# Patient Record
Sex: Female | Born: 1950 | Race: Black or African American | Hispanic: No | Smoking: Never smoker
Health system: Southern US, Community
[De-identification: ages and names within clinical notes are randomized; demographics above are authoritative.]

## PROBLEM LIST (undated history)

## (undated) DIAGNOSIS — C50919 Malignant neoplasm of unspecified site of unspecified female breast: Secondary | ICD-10-CM

## (undated) DIAGNOSIS — I119 Hypertensive heart disease without heart failure: Secondary | ICD-10-CM

## (undated) DIAGNOSIS — E7800 Pure hypercholesterolemia, unspecified: Secondary | ICD-10-CM

## (undated) DIAGNOSIS — G4730 Sleep apnea, unspecified: Secondary | ICD-10-CM

## (undated) DIAGNOSIS — M1990 Unspecified osteoarthritis, unspecified site: Secondary | ICD-10-CM

## (undated) DIAGNOSIS — C259 Malignant neoplasm of pancreas, unspecified: Secondary | ICD-10-CM

## (undated) DIAGNOSIS — R011 Cardiac murmur, unspecified: Secondary | ICD-10-CM

## (undated) DIAGNOSIS — Z8669 Personal history of other diseases of the nervous system and sense organs: Secondary | ICD-10-CM

## (undated) DIAGNOSIS — I350 Nonrheumatic aortic (valve) stenosis: Secondary | ICD-10-CM

## (undated) DIAGNOSIS — K219 Gastro-esophageal reflux disease without esophagitis: Secondary | ICD-10-CM

## (undated) DIAGNOSIS — Z853 Personal history of malignant neoplasm of breast: Secondary | ICD-10-CM

## (undated) DIAGNOSIS — R51 Headache: Secondary | ICD-10-CM

## (undated) DIAGNOSIS — I341 Nonrheumatic mitral (valve) prolapse: Secondary | ICD-10-CM

## (undated) DIAGNOSIS — C801 Malignant (primary) neoplasm, unspecified: Secondary | ICD-10-CM

## (undated) DIAGNOSIS — Z87442 Personal history of urinary calculi: Secondary | ICD-10-CM

## (undated) DIAGNOSIS — I10 Essential (primary) hypertension: Secondary | ICD-10-CM

## (undated) HISTORY — DX: Essential (primary) hypertension: I10

## (undated) HISTORY — DX: Hypertensive heart disease without heart failure: I11.9

## (undated) HISTORY — DX: Nonrheumatic aortic (valve) stenosis: I35.0

## (undated) HISTORY — DX: Personal history of malignant neoplasm of breast: Z85.3

## (undated) HISTORY — DX: Personal history of other diseases of the nervous system and sense organs: Z86.69

## (undated) HISTORY — PX: MASTECTOMY: SHX3

## (undated) HISTORY — DX: Nonrheumatic mitral (valve) prolapse: I34.1

## (undated) HISTORY — DX: Pure hypercholesterolemia, unspecified: E78.00

---
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
## 1992-08-27 HISTORY — PX: TUBAL LIGATION: SHX77

## 2000-03-27 ENCOUNTER — Emergency Department (HOSPITAL_COMMUNITY): Admission: EM | Admit: 2000-03-27 | Discharge: 2000-03-27 | Payer: Self-pay | Admitting: Emergency Medicine

## 2000-04-19 ENCOUNTER — Other Ambulatory Visit: Admission: RE | Admit: 2000-04-19 | Discharge: 2000-04-19 | Payer: Self-pay | Admitting: Obstetrics and Gynecology

## 2000-05-08 ENCOUNTER — Ambulatory Visit (HOSPITAL_COMMUNITY): Admission: RE | Admit: 2000-05-08 | Discharge: 2000-05-08 | Payer: Self-pay | Admitting: Obstetrics and Gynecology

## 2000-05-08 ENCOUNTER — Encounter: Payer: Self-pay | Admitting: Obstetrics and Gynecology

## 2000-09-03 ENCOUNTER — Other Ambulatory Visit: Admission: RE | Admit: 2000-09-03 | Discharge: 2000-09-03 | Payer: Self-pay | Admitting: Obstetrics and Gynecology

## 2000-09-03 ENCOUNTER — Encounter (INDEPENDENT_AMBULATORY_CARE_PROVIDER_SITE_OTHER): Payer: Self-pay | Admitting: Specialist

## 2001-02-15 ENCOUNTER — Observation Stay (HOSPITAL_COMMUNITY): Admission: EM | Admit: 2001-02-15 | Discharge: 2001-02-16 | Payer: Self-pay | Admitting: Emergency Medicine

## 2001-02-15 ENCOUNTER — Encounter: Payer: Self-pay | Admitting: Emergency Medicine

## 2001-06-23 ENCOUNTER — Ambulatory Visit (HOSPITAL_COMMUNITY): Admission: RE | Admit: 2001-06-23 | Discharge: 2001-06-23 | Payer: Self-pay | Admitting: Obstetrics and Gynecology

## 2001-06-23 ENCOUNTER — Encounter: Payer: Self-pay | Admitting: Obstetrics and Gynecology

## 2001-09-26 ENCOUNTER — Other Ambulatory Visit: Admission: RE | Admit: 2001-09-26 | Discharge: 2001-09-26 | Payer: Self-pay | Admitting: Obstetrics and Gynecology

## 2002-06-26 ENCOUNTER — Ambulatory Visit (HOSPITAL_COMMUNITY): Admission: RE | Admit: 2002-06-26 | Discharge: 2002-06-26 | Payer: Self-pay | Admitting: Obstetrics and Gynecology

## 2002-06-26 ENCOUNTER — Encounter: Payer: Self-pay | Admitting: Obstetrics and Gynecology

## 2002-12-22 ENCOUNTER — Other Ambulatory Visit: Admission: RE | Admit: 2002-12-22 | Discharge: 2002-12-22 | Payer: Self-pay | Admitting: Obstetrics and Gynecology

## 2003-11-12 ENCOUNTER — Other Ambulatory Visit: Admission: RE | Admit: 2003-11-12 | Discharge: 2003-11-12 | Payer: Self-pay | Admitting: Obstetrics and Gynecology

## 2003-12-10 ENCOUNTER — Ambulatory Visit (HOSPITAL_COMMUNITY): Admission: RE | Admit: 2003-12-10 | Discharge: 2003-12-10 | Payer: Self-pay | Admitting: Obstetrics and Gynecology

## 2004-01-03 ENCOUNTER — Encounter: Admission: RE | Admit: 2004-01-03 | Discharge: 2004-01-03 | Payer: Self-pay | Admitting: Obstetrics and Gynecology

## 2004-04-18 ENCOUNTER — Encounter: Admission: RE | Admit: 2004-04-18 | Discharge: 2004-05-05 | Payer: Self-pay | Admitting: Occupational Medicine

## 2005-02-01 ENCOUNTER — Other Ambulatory Visit: Admission: RE | Admit: 2005-02-01 | Discharge: 2005-02-01 | Payer: Self-pay | Admitting: Obstetrics and Gynecology

## 2005-02-09 ENCOUNTER — Encounter: Admission: RE | Admit: 2005-02-09 | Discharge: 2005-02-09 | Payer: Self-pay | Admitting: Obstetrics and Gynecology

## 2005-07-09 ENCOUNTER — Encounter (INDEPENDENT_AMBULATORY_CARE_PROVIDER_SITE_OTHER): Payer: Self-pay | Admitting: *Deleted

## 2005-07-09 ENCOUNTER — Ambulatory Visit (HOSPITAL_COMMUNITY): Admission: RE | Admit: 2005-07-09 | Discharge: 2005-07-09 | Payer: Self-pay | Admitting: Gastroenterology

## 2006-05-03 ENCOUNTER — Encounter: Admission: RE | Admit: 2006-05-03 | Discharge: 2006-05-03 | Payer: Self-pay | Admitting: Obstetrics and Gynecology

## 2006-05-14 ENCOUNTER — Encounter: Admission: RE | Admit: 2006-05-14 | Discharge: 2006-05-14 | Payer: Self-pay | Admitting: Obstetrics and Gynecology

## 2006-08-27 HISTORY — PX: OTHER SURGICAL HISTORY: SHX169

## 2006-09-16 ENCOUNTER — Encounter: Admission: RE | Admit: 2006-09-16 | Discharge: 2006-09-16 | Payer: Self-pay | Admitting: Occupational Medicine

## 2006-11-11 ENCOUNTER — Encounter: Admission: RE | Admit: 2006-11-11 | Discharge: 2006-12-13 | Payer: Self-pay | Admitting: Internal Medicine

## 2006-11-25 ENCOUNTER — Encounter: Admission: RE | Admit: 2006-11-25 | Discharge: 2006-11-25 | Payer: Self-pay | Admitting: Obstetrics and Gynecology

## 2006-11-25 ENCOUNTER — Encounter (INDEPENDENT_AMBULATORY_CARE_PROVIDER_SITE_OTHER): Payer: Self-pay | Admitting: *Deleted

## 2006-11-25 ENCOUNTER — Encounter (INDEPENDENT_AMBULATORY_CARE_PROVIDER_SITE_OTHER): Payer: Self-pay | Admitting: Diagnostic Radiology

## 2006-11-25 DIAGNOSIS — C50919 Malignant neoplasm of unspecified site of unspecified female breast: Secondary | ICD-10-CM

## 2006-11-25 HISTORY — DX: Malignant neoplasm of unspecified site of unspecified female breast: C50.919

## 2006-12-12 ENCOUNTER — Encounter: Admission: RE | Admit: 2006-12-12 | Discharge: 2006-12-12 | Payer: Self-pay | Admitting: Obstetrics and Gynecology

## 2007-01-22 ENCOUNTER — Encounter (INDEPENDENT_AMBULATORY_CARE_PROVIDER_SITE_OTHER): Payer: Self-pay | Admitting: Surgery

## 2007-01-22 ENCOUNTER — Inpatient Hospital Stay (HOSPITAL_COMMUNITY): Admission: RE | Admit: 2007-01-22 | Discharge: 2007-01-24 | Payer: Self-pay | Admitting: Surgery

## 2007-01-28 ENCOUNTER — Ambulatory Visit: Payer: Self-pay | Admitting: Oncology

## 2007-04-03 ENCOUNTER — Emergency Department (HOSPITAL_COMMUNITY): Admission: EM | Admit: 2007-04-03 | Discharge: 2007-04-03 | Payer: Self-pay | Admitting: *Deleted

## 2007-06-17 ENCOUNTER — Encounter: Payer: Self-pay | Admitting: Endocrinology

## 2007-06-25 ENCOUNTER — Encounter: Payer: Self-pay | Admitting: Endocrinology

## 2007-07-10 ENCOUNTER — Ambulatory Visit: Payer: Self-pay | Admitting: Endocrinology

## 2007-07-10 DIAGNOSIS — I10 Essential (primary) hypertension: Secondary | ICD-10-CM | POA: Insufficient documentation

## 2007-07-10 DIAGNOSIS — I359 Nonrheumatic aortic valve disorder, unspecified: Secondary | ICD-10-CM | POA: Insufficient documentation

## 2007-07-10 DIAGNOSIS — E119 Type 2 diabetes mellitus without complications: Secondary | ICD-10-CM | POA: Insufficient documentation

## 2007-07-16 ENCOUNTER — Telehealth (INDEPENDENT_AMBULATORY_CARE_PROVIDER_SITE_OTHER): Payer: Self-pay | Admitting: *Deleted

## 2007-07-18 ENCOUNTER — Telehealth: Payer: Self-pay | Admitting: Endocrinology

## 2007-07-22 ENCOUNTER — Ambulatory Visit: Payer: Self-pay | Admitting: Endocrinology

## 2007-07-28 ENCOUNTER — Encounter: Payer: Self-pay | Admitting: Endocrinology

## 2007-08-18 ENCOUNTER — Encounter: Admission: RE | Admit: 2007-08-18 | Discharge: 2007-08-18 | Payer: Self-pay | Admitting: Endocrinology

## 2007-10-01 ENCOUNTER — Telehealth: Payer: Self-pay | Admitting: Endocrinology

## 2007-10-14 ENCOUNTER — Encounter: Payer: Self-pay | Admitting: Endocrinology

## 2008-05-29 ENCOUNTER — Emergency Department (HOSPITAL_COMMUNITY): Admission: EM | Admit: 2008-05-29 | Discharge: 2008-05-29 | Payer: Self-pay | Admitting: Emergency Medicine

## 2008-09-20 ENCOUNTER — Ambulatory Visit: Payer: Self-pay | Admitting: Sports Medicine

## 2008-09-20 DIAGNOSIS — M719 Bursopathy, unspecified: Secondary | ICD-10-CM

## 2008-09-20 DIAGNOSIS — M67919 Unspecified disorder of synovium and tendon, unspecified shoulder: Secondary | ICD-10-CM | POA: Insufficient documentation

## 2008-09-21 ENCOUNTER — Encounter: Payer: Self-pay | Admitting: Sports Medicine

## 2008-09-29 ENCOUNTER — Encounter: Admission: RE | Admit: 2008-09-29 | Discharge: 2008-12-14 | Payer: Self-pay | Admitting: Sports Medicine

## 2008-10-04 ENCOUNTER — Encounter: Payer: Self-pay | Admitting: Sports Medicine

## 2008-10-13 ENCOUNTER — Encounter: Payer: Self-pay | Admitting: Sports Medicine

## 2008-10-25 ENCOUNTER — Ambulatory Visit: Payer: Self-pay | Admitting: Sports Medicine

## 2008-11-04 ENCOUNTER — Ambulatory Visit: Payer: Self-pay | Admitting: Oncology

## 2008-11-08 LAB — COMPREHENSIVE METABOLIC PANEL
ALT: 17 U/L (ref 0–35)
Albumin: 4.5 g/dL (ref 3.5–5.2)
Alkaline Phosphatase: 83 U/L (ref 39–117)
CO2: 25 mEq/L (ref 19–32)
Glucose, Bld: 112 mg/dL — ABNORMAL HIGH (ref 70–99)
Potassium: 3.7 mEq/L (ref 3.5–5.3)
Sodium: 138 mEq/L (ref 135–145)
Total Bilirubin: 0.3 mg/dL (ref 0.3–1.2)
Total Protein: 7.4 g/dL (ref 6.0–8.3)

## 2008-11-08 LAB — CBC WITH DIFFERENTIAL/PLATELET
BASO%: 0.5 % (ref 0.0–2.0)
Eosinophils Absolute: 0.1 10*3/uL (ref 0.0–0.5)
LYMPH%: 37.2 % (ref 14.0–49.7)
MCHC: 33.4 g/dL (ref 31.5–36.0)
MONO#: 0.5 10*3/uL (ref 0.1–0.9)
MONO%: 6.7 % (ref 0.0–14.0)
NEUT#: 3.7 10*3/uL (ref 1.5–6.5)
RBC: 3.94 10*6/uL (ref 3.70–5.45)
RDW: 13.1 % (ref 11.2–14.5)
WBC: 6.8 10*3/uL (ref 3.9–10.3)

## 2008-12-22 ENCOUNTER — Encounter: Payer: Self-pay | Admitting: Sports Medicine

## 2009-02-15 ENCOUNTER — Ambulatory Visit: Payer: Self-pay | Admitting: Sports Medicine

## 2009-07-04 ENCOUNTER — Emergency Department: Admission: EM | Admit: 2009-07-04 | Discharge: 2009-07-04 | Payer: Self-pay | Admitting: Emergency Medicine

## 2010-09-17 ENCOUNTER — Encounter: Payer: Self-pay | Admitting: Obstetrics and Gynecology

## 2011-01-09 NOTE — Op Note (Signed)
NAMEMarland Hicks  April, Hicks NO.:  [id number]   MEDICAL RECORD NO.:  [id number]          PATIENT TYPE:  INP   LOCATION:  2899                         FACILITY:  [facility]   PHYSICIAN:  Currie Paris, M.D.DATE OF BIRTH:  04-13-1951   DATE OF PROCEDURE:  01/22/2007  DATE OF DISCHARGE:                               OPERATIVE REPORT   OFFICE MEDICAL RECORD NUMBER [id number]   PREOPERATIVE DIAGNOSIS:  Ductal carcinoma in situ, left breast, lower  inner quadrant.   POSTOPERATIVE DIAGNOSIS:  Ductal carcinoma in situ, left breast, lower  inner quadrant.   PROCEDURE:  Left total mastectomy with blue dye injection and sentinel  lymph node biopsy; right total mastectomy.   SURGEON:  Currie Paris, M.D.   ASSISTANT:  Rose Phi. Maple Hudson, M.D.   ANESTHESIA:  General endotracheal.   CLINICAL HISTORY:  This is a 60 year old lady who has just been  diagnosed with an early breast cancer.  She has a family history of  breast cancer and was strongly desirous not only of a mastectomy for her  known cancer but a prophylactic mastectomy on the contralateral side.  We spoke to her length about that, and I went over the fact that this  would not likely lead to any increase in survivability, given that she  has only DCIS.  We talk to her about the standard being for a lumpectomy  and radiation and that since she has low-grade cancer, she might have  been able to escape radiation.  She was seen in consultation by  radiation therapy.  She also saw a Engineer, petroleum, and after giving  this full consideration elected to proceed as she had originally planned  with a left total mastectomy, sentinel lymph node biopsy, and right  total mastectomy.  She plans for a delayed reconstruction and has seen  Dr. Pleas Patricia.   DESCRIPTION OF PROCEDURE:  The patient was seen in the holding area, and  she had no further questions.  We reviewed again that we planned for a  left total mastectomy  with sentinel node evaluation and right total  mastectomy.  I initialed the left side as the operative side as the  patient had already done.  When I saw her, she had already had her  radioisotope injected.   The patient was taken to the operating room and after satisfactory  general anesthesia had been obtained, the area around the left nipple-  areolar complex was prepped with alcohol.  The time out occurred.  I  then injected 5 mL of dilute methylene blue (2 mL of methylene blue and  3 mL of injectable saline) subareolarly and massaged that in.   At this point, both breasts were prepped with Betadine and draped as a  single sterile field.  The right breast was covered with a towel.  I  used a Neoprobe and identified and marked a hot area in the axilla.  I  then made an elliptical incision and raised a superior flap medially to  the sternum, superiorly to the clavicle, and laterally out into the  axilla.  Once we got into the axillary fatty tissue, I used  the Neoprobe  and found where the node was.  I made a little subcu divided and then  found 2 blue lymphatics leading to what was either 1 large or 2 separate  fairly closely attached lymph nodes, both of which had counts in the  500s.  These were excised and sent as a sentinel node.  I found no other  blue nodes and no other palpably abnormal nodes and no hot areas in the  axilla.   Attention was turned back to the breast, and I completed the inferior  skin flap going to the inframammary fold and then laterally out to the  latissimus.  The breast was then removed starting medially and working  laterally using cautery and taking the fascia.  When I got to the  clavipectoral fascia, I was able to put some traction on the breast and  divide that and then divide the breast off of the chest wall without any  further entry into the axilla.  We spent several minutes making sure  everything was dry.  I put two 19 Blake drains in and secured  them with  a 3-0 nylon, leaving 1 in the axilla and 1 under the superior flap.  We  irrigated and again checked for hemostasis.  Everything was dry and the  wound was closed with staples.   Instruments and gloves were changed, and we covered the left side with a  towel and then exposed the right side.  This mastectomy was completed  essentially in the same fashion as the left, except that we did not did  not enter the axilla proper at all.  Flaps were made in a similar  fashion, and the breast was removed from medial to lateral, taking the  fascia.  Again, we irrigated and made sure everything was dry.  I placed  19 Blake drains and watched them for a few minutes to make sure the  flaps stayed dry and then closed with staples.   The patient tolerated the procedure well.  There no operative  complications.  All counts were correct.  Estimated blood loss was 150  mL.      Currie Paris, M.D.  Electronically Signed     CJS/MEDQ  D:  01/22/2007  T:  01/22/2007  Job:  [id number]   cc:   Marcelino Duster L. Vincente Poli, M.D.  Consuello Bossier., M.D.

## 2011-01-12 NOTE — Discharge Summary (Signed)
[facility]  Patient:    [name], [name]               MRN: [id number] Adm. Date:  [id number] Disc. Date: [id number] Attending:  Rudean Hitt CC:         Redmond Baseman, M.D.   Discharge Summary  FINAL DIAGNOSES: 1. Chest pain, myocardial infarction ruled out. 2. Mitral valve prolapse. 3. Hypertensive cardiovascular disease. 4. History of hypercholesterolemia. 5. Mild anemia.  PROCEDURE:  None.  HISTORY OF PRESENT ILLNESS:  This is a 60 year old black female admitted with substernal pain and left chest pain.  She had the onset of pain about 10 p.m. the night before she came to the emergency room.  It occurred while she was watching TV.  She had had oriental food for supper several hours earlier. The intensity of the pain waxed and waned.  The pain was substernal and also behind the left breast and down into the left biceps.  There was slight dyspnea, but no nausea, vomiting, or diaphoresis.  She took three aspirin at about 3 a.m. prior to coming to the emergency room and the pain seemed less severe after that.  The patient does have a past history of high blood pressure and has been on Hyzaar once a day 100/25 strength.  She is also on Premphase for early menopausea. She has had a known heart murmur since birth and has been told of a mitral valve prolapse and does take SBE prophylaxis.  FAMILY HISTORY: REVIEW OF SYSTEMS: SOCIAL HISTORY:  As noted in the H&P.  PHYSICAL EXAMINATION:  Unremarkable except for a grade 3/6 harsh systolic murmur loudest at the base, but also heard at the apex and radiates to the neck. There is no diastolic murmur, no gallop, click, or rub.  ABDOMEN: negative.  EXTREMITIES: Negative.  VITAL SIGNS: Blood pressure 139/74.  Her chest x-ray was normal.  EKG showed nonspecific T wave changes with T wave inversions in 2, 3, aVF and V3 through V5.  Repeat EKG the next day showed  no change.  LABORATORY DATA:  Admission labs were unremarkable with potassium of 3.7.  HOSPITAL COURSE:  The patient was admitted to telemetry and serial enzymes were obtained.  She was treated empirically with IV heparin.  She had no further chest discomfort.  Enzymes came back negative x 3.  She was able to be discharged on Sunday morning, November 16, 2000, to be followed closely in the office.  I felt that her chest pain may be secondary to mitral valve prolapse. A gastrointestinal etiology could not be excluded as well.  ACCESSORY CLINICAL DATA:  Hematocrit 34, hemoglobin 11.9, potassium 3.7, liver function tests were normal.  Cardiac enzymes were negative x 3.  Urinalysis was negative with negative nitrite and few bacteria.  Lipid panel was drawn on the morning of discharge and is pending.  The patient will be discharged on Premphase as directed, Nitrostat 1/150 p.r.n. for chest pain, Protonix 40 mg one daily, Hyzaar 100/25 one daily, coated aspirin 81 mg daily.  She is to walk as tolerated.  She will be on a low cholesterol diet avoiding extra salt.  She is to call my office on Monday, June 24, to arrange for a one-day treadmill Cardiolite stress test as an outpatient.  Her weight at discharge is 212.  CONDITION ON DISCHARGE:  Improved. DD:  02/16/01 TD:  02/17/01 Job: [id number]

## 2011-01-12 NOTE — H&P (Signed)
[facility]  Patient:    [name], [name]               MRN: [id number] Adm. Date:  [id number] Attending:  Cathren Laine CC:         Redmond Baseman, M.D., [facility]   History and Physical  CHIEF COMPLAINT:  Chest pain.  HISTORY:  This is a 60 year old black female admitted with substernal and left chest discomfort.  She had the onset of the discomfort at about 10 p.m. last evening while television.  She had had some oriental food for supper and initially felt it might be indigestion but did not take any antacids.  The intensity of the pain waxed and waned, and she found it difficult to sleep. She tried using her CPAP machine and noticed that the pain improved somewhat. At about 3 a.m. she took some aspirin, and this also seemed to help the pain. The pain was in the substernal area, radiating through under the left breast, and up into the shoulder and down into the left biceps area.  There was slight dyspnea.  There was no diaphoresis or nausea.  The patient has a past history of high blood pressure and has been on medication for about a year and a half.  Initially she was on Toprol and a diuretic and now is on Hyzaar 100/25, 1 daily.  Other medications include Premphase 0.625/5, as directed.  Of note, is the fact that the patient has had a heart murmur since birth and prior to moving to [place], she got an echocardiogram at a hospital in [place] each year.  She does know about SVE prophylaxis and uses it.  SOCIAL HISTORY:  She is a Engineer, civil (consulting) at [employer].  She is a nonsmoker.  She uses occasional alcohol.  She is married and has two children.  ALLERGIES:  PENICILLIN, SHELLFISH, IODINE.  FAMILY HISTORY:  Mother is living at age 70 and is in reasonably good health. Father died at age 7 of a stroke and heart failure.  The patient has four sisters and four brothers.  All are in good health.  None of her  family members have had any known premature coronary disease or heart murmurs, as far as she knows.  The patient herself does have a history of elevated cholesterol.  REVIEW OF SYSTEMS:  GASTROINTESTINAL:  No history of peptic ulcer, hiatal hernia, or gastroesophageal reflux.  GENITOURINARY:  No symptoms.  The remainder of the review of systems unremarkable.  PAST SURGICAL HISTORY:  Fractured hip at age 21.  She has also had a tubal ligation.  PHYSICAL EXAMINATION:  VITAL SIGNS:  Blood pressure 139/74, pulse 63 and regular, respirations normal.  HEENT:  Negative.  NECK:  Jugular venous pressure normal.  Carotids have a normal upstroke but has a loud bruit bilaterally transmitted from the heart.  CHEST:  Clear.  HEART:  Grade 3/6 harsh systolic ejection murmur loudest at the base but also right at the apex.  It seems to radiate most to the neck.  There is n o gallop or rub.  ABDOMEN:  Soft and nontender.  EXTREMITIES:  Good pulses.  No edema.  Chest x-ray shows no active disease.  Heart size is upper normal.  Lungs are clear.  Electrocardiogram shows nonspecific T wave inversion in leads II, III, aVF, and V3 through V5.  Initial CK-MB and troponin I are negative.  Potassium is normal at 3.7.  Renal function is normal.  IMPRESSION: 1. Chest pain, rule out myocardial infarction. 2. Harsh systolic murmur, loudest at the base suggestive of idiopathic    hypertrophic subaortic stenosis or aortic stenosis, although the patient    has been told of mitral valve prolapse in the past. 3. History of hypercholesterolemia. 4. History of hypertension.  DISPOSITION:  Admit to telemetry.  Serial enzymes and EKGs.  IV heparin by pharmacy protocol.  Sublingual nitroglycerin as necessary for recurrent pain. Daily aspirin.  Will anticipate getting an electrocardiogram either as an inpatient or an outpatient.  Echoes are not available on the weekends. DD:  02/15/01 TD:  02/15/01 Job:  [id number]

## 2011-01-12 NOTE — Op Note (Signed)
[name], [name]      ACCOUNT NO.:  [id number]   MEDICAL RECORD NO.:  [id number]          PATIENT TYPE:  AMB   LOCATION:  ENDO                         FACILITY:  [facility]   PHYSICIAN:  Petra Kuba, M.D.    DATE OF BIRTH:  1950/09/05   DATE OF PROCEDURE:  07/09/2005  DATE OF DISCHARGE:                                 OPERATIVE REPORT   PROCEDURE:  Colonoscopy.   INDICATIONS FOR PROCEDURE:  Screening.  Consent was signed after risks,  benefits, methods, and options were thoroughly discussed in the office.   MEDICATIONS USED:  Demerol 100, Versed 10.5.   PROCEDURE:  Rectal inspection was pertinent for small external hemorrhoids.  Digital exam was negative.  The video colonoscope was inserted and with some  difficulty due to a long looping colon, with rolling her on her back and  abdominal pressure, we were able to advance to the cecum which was  identified by the appendiceal orifice and the ileocecal valve.  A few  sigmoid polyps were seen on insertion, these were significant but fairly  distal, with one right next to each other.  No other abnormalities were seen  on insertion.  The scope was slowly withdrawn.  The prep was adequate, there  was minimal liquid stool that required washing and suctioning.  On slow  withdrawal through the colon, the cecum, ascending, transverse, and  descending were all normal.  The scope was slowly withdrawn back to the  distal sigmoid.  Both polyps that were seen on insertion were brought into  view and both were snared, electrocautery applied, and the polyps were  removed, suctioned through the scope and collected in the trap, and put in  the same container.  The scope was withdrawn back to the rectum.  Anorectal  pull through and retroflexion confirmed some small hemorrhoids.  The scope  was straightened and readvanced a short ways up the left side of the colon,  air was suctioned, the scope was removed.  The patient tolerated the  procedure well.  There was no obvious complications.   ENDOSCOPIC DIAGNOSIS:  1.  Internal and external hemorrhoids.  2.  Two tiny to small distal sigmoid polyps, snared.  3.  Otherwise, within normal limits to the cecum.   PLAN:  Await pathology, probably recheck in five years, happy to see back  sooner p.r.n., otherwise return care to Dr. Vincente Hicks for the customary health  care screening and maintenance.           ______________________________  Petra Kuba, M.D.     MEM/MEDQ  D:  07/09/2005  T:  07/10/2005  Job:  [id number]   cc:   [name], M.D.  Fax: [phone]

## 2011-03-16 ENCOUNTER — Other Ambulatory Visit: Payer: Self-pay | Admitting: Cardiology

## 2011-03-16 DIAGNOSIS — E785 Hyperlipidemia, unspecified: Secondary | ICD-10-CM

## 2011-03-16 NOTE — Telephone Encounter (Signed)
escribe request  

## 2011-04-09 ENCOUNTER — Telehealth: Payer: Self-pay | Admitting: Cardiology

## 2011-04-09 ENCOUNTER — Other Ambulatory Visit: Payer: Self-pay | Admitting: Cardiology

## 2011-04-09 NOTE — Telephone Encounter (Signed)
escribe request  

## 2011-04-09 NOTE — Telephone Encounter (Signed)
Wants to come by and get samples of Benecar. She said that she is out of them and realizes that she is due to make an appointment with Dr. Patty Sermons.

## 2011-05-28 ENCOUNTER — Telehealth: Payer: Self-pay | Admitting: Cardiology

## 2011-05-28 NOTE — Telephone Encounter (Signed)
Walk In pt Form " Pt Brought in Exemption Questionnaire Form" sent to [facility] Nurse for Today)  05/28/11/km

## 2011-07-02 ENCOUNTER — Ambulatory Visit: Payer: Self-pay | Admitting: Cardiology

## 2011-07-25 ENCOUNTER — Other Ambulatory Visit: Payer: Self-pay | Admitting: *Deleted

## 2011-07-25 MED ORDER — BENICAR HCT 40-12.5 MG PO TABS: 1.0000 | ORAL_TABLET | Freq: Every day | ORAL | Status: DC

## 2011-07-26 ENCOUNTER — Other Ambulatory Visit: Payer: Self-pay | Admitting: Cardiology

## 2011-07-26 ENCOUNTER — Other Ambulatory Visit: Payer: Self-pay | Admitting: *Deleted

## 2011-07-26 MED ORDER — BENICAR HCT 40-12.5 MG PO TABS: 1.0000 | ORAL_TABLET | Freq: Every day | ORAL | Status: DC

## 2011-07-26 NOTE — Telephone Encounter (Signed)
New Msg: Pt at pharmacy now, needs refill of Benicar called in. Pt has appt with Dr. Patty Sermons scheduled for 12/17--but doesn't have enough RX to last until then. Pt call transferred to triage.

## 2011-07-26 NOTE — Telephone Encounter (Signed)
Called requesting refill on Benicar. Is at [facility] pharmacy now.

## 2011-08-10 ENCOUNTER — Encounter: Payer: Self-pay | Admitting: *Deleted

## 2011-08-10 DIAGNOSIS — I341 Nonrheumatic mitral (valve) prolapse: Secondary | ICD-10-CM | POA: Insufficient documentation

## 2011-08-10 DIAGNOSIS — E7800 Pure hypercholesterolemia, unspecified: Secondary | ICD-10-CM | POA: Insufficient documentation

## 2011-08-10 DIAGNOSIS — Z8669 Personal history of other diseases of the nervous system and sense organs: Secondary | ICD-10-CM | POA: Insufficient documentation

## 2011-08-10 DIAGNOSIS — I350 Nonrheumatic aortic (valve) stenosis: Secondary | ICD-10-CM | POA: Insufficient documentation

## 2011-08-10 DIAGNOSIS — Z853 Personal history of malignant neoplasm of breast: Secondary | ICD-10-CM | POA: Insufficient documentation

## 2011-08-10 DIAGNOSIS — I119 Hypertensive heart disease without heart failure: Secondary | ICD-10-CM | POA: Insufficient documentation

## 2011-08-13 ENCOUNTER — Ambulatory Visit: Payer: Self-pay | Admitting: Cardiology

## 2011-09-10 ENCOUNTER — Encounter: Payer: Self-pay | Admitting: Cardiology

## 2011-09-10 ENCOUNTER — Ambulatory Visit (INDEPENDENT_AMBULATORY_CARE_PROVIDER_SITE_OTHER): Payer: Self-pay | Admitting: Cardiology

## 2011-09-10 VITALS — BP 140/94 | HR 61 | Ht 64.0 in | Wt 219.0 lb

## 2011-09-10 DIAGNOSIS — E785 Hyperlipidemia, unspecified: Secondary | ICD-10-CM

## 2011-09-10 DIAGNOSIS — I350 Nonrheumatic aortic (valve) stenosis: Secondary | ICD-10-CM

## 2011-09-10 DIAGNOSIS — I10 Essential (primary) hypertension: Secondary | ICD-10-CM

## 2011-09-10 DIAGNOSIS — E7800 Pure hypercholesterolemia, unspecified: Secondary | ICD-10-CM

## 2011-09-10 DIAGNOSIS — E119 Type 2 diabetes mellitus without complications: Secondary | ICD-10-CM

## 2011-09-10 DIAGNOSIS — I359 Nonrheumatic aortic valve disorder, unspecified: Secondary | ICD-10-CM

## 2011-09-10 DIAGNOSIS — I119 Hypertensive heart disease without heart failure: Secondary | ICD-10-CM

## 2011-09-10 LAB — BASIC METABOLIC PANEL
CO2: 25 mEq/L (ref 19–32)
Chloride: 99 mEq/L (ref 96–112)
Potassium: 3.7 mEq/L (ref 3.5–5.1)

## 2011-09-10 LAB — HEPATIC FUNCTION PANEL
ALT: 21 U/L (ref 0–35)
AST: 20 U/L (ref 0–37)
Alkaline Phosphatase: 72 U/L (ref 39–117)
Bilirubin, Direct: 0.1 mg/dL (ref 0.0–0.3)
Total Bilirubin: 0.4 mg/dL (ref 0.3–1.2)
Total Protein: 7.7 g/dL (ref 6.0–8.3)

## 2011-09-10 LAB — LIPID PANEL
Cholesterol: 276 mg/dL — ABNORMAL HIGH (ref 0–200)
Total CHOL/HDL Ratio: 5
VLDL: 23.2 mg/dL (ref 0.0–40.0)

## 2011-09-10 MED ORDER — BENICAR HCT 40-12.5 MG PO TABS: 1.0000 | ORAL_TABLET | Freq: Every day | ORAL | Status: DC

## 2011-09-10 MED ORDER — ZETIA 10 MG PO TABS: 10.0000 mg | ORAL_TABLET | Freq: Every day | ORAL | Status: DC

## 2011-09-10 NOTE — Progress Notes (Signed)
[name] Date of Birth:  05-31-1951 [facility] [address] [place], [place]  [address] [phone]         Fax   [phone]  History of Present Illness: This pleasant 61 year old African American woman is seen after an 18 month absence.  She has a past history of dyslipidemia, aortic stenosis, and diabetes.  She also has a history of essential hypertension.  Since last visit she has been feeling well.  He is not having him do cardiac symptoms.  She has a history of whitecoat hypertension.  She states that when she has her blood pressure checked at work is normal in the range of 130/78.  Current Outpatient Prescriptions  Medication Sig Dispense Refill  . aspirin 81 MG tablet Take 81 mg by mouth daily.        Marland Kitchen BENICAR HCT 40-12.5 MG per tablet Take 1 tablet by mouth daily.  90 tablet  3  . Calcium Carbonate-Vitamin D (CALCIUM 600 + D PO) Take by mouth daily.        . metFORMIN (GLUCOPHAGE) 500 MG tablet Take 500 mg by mouth daily.        . Multiple Vitamin (MULTIVITAMIN) tablet Take 1 tablet by mouth daily.      . Omega-3 Fatty Acids (FISH OIL PO) Take by mouth daily.        . vitamin C (ASCORBIC ACID) 500 MG tablet Take 500 mg by mouth 2 (two) times daily.      Marland Kitchen ZETIA 10 MG tablet Take 1 tablet (10 mg total) by mouth daily.  90 tablet  3    Allergies  Allergen Reactions  . Crestor (Rosuvastatin Calcium)     myalgias  . Iodine   . Other     Shell fish  . Penicillins   . Pravachol     Muscle pain    Patient Active Problem List  Diagnoses  . DIABETES MELLITUS, TYPE II  . HYPERTENSION  . AORTIC STENOSIS  . Unspecified disorders of bursae and tendons in shoulder region  . Aortic stenosis  . Hypercholesterolemia  . MVP (mitral valve prolapse)  . Hypertensive cardiovascular disease  . HX: breast cancer  . History of migraines    History  Smoking status  . Never Smoker   Smokeless tobacco  . Not on file    History  Alcohol  Use     Family History  Problem Relation Age of Onset  . Hypertension Mother   . Hypertension Father     Review of Systems: Constitutional: no fever chills diaphoresis or fatigue or change in weight.  Head and neck: no hearing loss, no epistaxis, no photophobia or visual disturbance. Respiratory: No cough, shortness of breath or wheezing. Cardiovascular: No chest pain peripheral edema, palpitations. Gastrointestinal: No abdominal distention, no abdominal pain, no change in bowel habits hematochezia or melena. Genitourinary: No dysuria, no frequency, no urgency, no nocturia. Musculoskeletal:No arthralgias, no back pain, no gait disturbance or myalgias. Neurological: No dizziness, no headaches, no numbness, no seizures, no syncope, no weakness, no tremors. Hematologic: No lymphadenopathy, no easy bruising. Psychiatric: No confusion, no hallucinations, no sleep disturbance.    Physical Exam: Filed Vitals:   09/10/11 1110  BP: 140/94  Pulse:    the general appearance reveals a well-developed well-nourished woman in no distress.  Weight is down 2 pounds since last visit.  I rechecked her blood pressure and his elevated.  140/94.Pupils equal and reactive.   Extraocular Movements are full.  There is no scleral icterus.  The mouth and pharynx are normal.  The neck is supple.  The carotids reveal no bruits.  The jugular venous pressure is normal.  The thyroid is not enlarged.  There is no lymphadenopathy.  The chest is clear to percussion and auscultation. There are no rales or rhonchi. Expansion of the chest is symmetrical.  The precordium is quiet.  The first heart sound is normal.  The second heart sound is physiologically split.  There is no  gallop rub or click.  There is a grade 3/6 harsh systolic ejection murmur at the base radiating toward the neck.  There is no abnormal lift or heave. The abdomen is soft and nontender. Bowel sounds are normal. The liver and spleen are not enlarged.  There Are no abdominal masses. There are no bruits.  The pedal pulses are good.  There is no phlebitis or edema.  There is no cyanosis or clubbing. Strength is normal and symmetrical in all extremities.  There is no lateralizing weakness.  There are no sensory deficits.  The skin is warm and dry.  There is no rash.  EKG shows normal sinus rhythm and nonspecific anterior T wave changes.  The tracing is unchanged since 2005.   Assessment / Plan: She came fasting today and we sent her for lab work including lipid panel hepatic function panel and basal metabolic panel.  This reflects no lipid-lowering medication for the past 6 months.  She'll return in 6 months for followup office visit and fasting lab work.  Continue regular exercise and weight reduction and low salt heart healthy diabetic diet.

## 2011-09-10 NOTE — Patient Instructions (Addendum)
Will obtain labs today and call you with the results (lp/bmet/hfp) Your physician recommends that you continue on your current medications as directed. Please refer to the Current Medication list given to you today. Your physician wants you to follow-up in: 6 months You will receive a reminder letter in the mail two months in advance. If you don't receive a letter, please call our office to schedule the follow-up appointment.     

## 2011-09-10 NOTE — Assessment & Plan Note (Signed)
The patient has not had any symptoms from her blood pressure.  As noted her blood pressure outside the doctor's office is normal.  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

## 2011-09-10 NOTE — Assessment & Plan Note (Signed)
The patient is not having any cardinal symptoms of severe aortic stenosis.  Her last echocardiogram on 01/30/10 showed a peak gradient of 38 and a mean gradient of 21.

## 2011-09-10 NOTE — Assessment & Plan Note (Signed)
The patient has a history of hypercholesterolemia on a familial basis.  She is intolerant of statins.  She is able to take Z. theZetia but she has not taken any for the past 6 months.  She ran out and has not had it refilled.  He refilled it and also her Benicar HCT today.

## 2011-09-13 ENCOUNTER — Telehealth: Payer: Self-pay | Admitting: *Deleted

## 2011-09-13 NOTE — Telephone Encounter (Signed)
Message copied by Eugenia Pancoast on Thu Sep 13, 2011  1:24 PM ------      Message from: Cassell Clement      Created: Tue Sep 11, 2011  1:10 PM       Please report.  The cholesterol and the LDL cholesterol are still very high.  She is to start back on her Zetia now.  Continue strict diet and attempts at weight loss.

## 2011-09-13 NOTE — Telephone Encounter (Signed)
Lm to call back re: labs 

## 2011-09-14 ENCOUNTER — Telehealth: Payer: Self-pay | Admitting: Cardiology

## 2011-09-14 NOTE — Telephone Encounter (Signed)
PT RTN CALL RE RESULTS

## 2011-09-14 NOTE — Telephone Encounter (Signed)
Advised of labs.  Patient wanted to know if/when you wanted to get another Echo

## 2011-09-17 NOTE — Telephone Encounter (Signed)
We will anticipate getting an echo after her next OV in 6 months

## 2011-09-18 NOTE — Telephone Encounter (Signed)
Advised patient

## 2011-10-15 ENCOUNTER — Encounter (INDEPENDENT_AMBULATORY_CARE_PROVIDER_SITE_OTHER): Payer: Self-pay | Admitting: General Surgery

## 2011-10-15 ENCOUNTER — Ambulatory Visit (INDEPENDENT_AMBULATORY_CARE_PROVIDER_SITE_OTHER): Payer: Commercial Managed Care - PPO | Admitting: General Surgery

## 2011-10-15 VITALS — BP 146/84 | HR 84 | Temp 97.0°F | Resp 18 | Ht 66.0 in | Wt 221.6 lb

## 2011-10-15 DIAGNOSIS — K644 Residual hemorrhoidal skin tags: Secondary | ICD-10-CM

## 2011-10-15 NOTE — Patient Instructions (Signed)
Stay hydrated aquaphor after shower Avoid constipation Call if it flares up

## 2011-10-16 ENCOUNTER — Encounter (INDEPENDENT_AMBULATORY_CARE_PROVIDER_SITE_OTHER): Payer: Self-pay | Admitting: General Surgery

## 2011-10-16 NOTE — Progress Notes (Signed)
Subjective:     Patient ID: [name], female   DOB: 1951/06/07, 61 y.o.   MRN: [id number]  HPI We are asked to see the patient in consultation by Dr. Vincente Poli to evaluate her for her hemorrhoids. The patient is a 61 year old white female who believe she's had problems with hemorrhoids for a number of years. She develops some discomfort about every 2-3 months. She denies any bleeding with her bowel movements. She denies any problems with constipation. She usually uses tucks pads which helps them to go away. She works as a Engineer, civil (consulting) and stands all day and feels as though they bulged but more at the end of the day when she's been upright.  Review of Systems  Constitutional: Negative.   HENT: Negative.   Eyes: Negative.   Respiratory: Negative.   Cardiovascular: Negative.   Gastrointestinal: Negative.   Genitourinary: Negative.   Musculoskeletal: Negative.   Skin: Negative.   Neurological: Negative.   Hematological: Negative.   Psychiatric/Behavioral: Negative.        Objective:   Physical Exam  Constitutional: She is oriented to person, place, and time. She appears well-developed and well-nourished.  HENT:  Head: Normocephalic and atraumatic.  Eyes: Conjunctivae and EOM are normal. Pupils are equal, round, and reactive to light.  Neck: Normal range of motion. Neck supple.  Cardiovascular: Normal rate, regular rhythm and normal heart sounds.   Pulmonary/Chest: Effort normal and breath sounds normal.  Abdominal: Soft. Bowel sounds are normal.  Genitourinary:       Her perirectal skin looks good. She has one small soft benign-appearing hemorrhoidal skin tag anteriorly. She has good rectal tone and no palpable mass. On anoscopic exam I do not see much in the way of internal hemorrhoidal tissue  Musculoskeletal: Normal range of motion.  Neurological: She is alert and oriented to person, place, and time.  Skin: Skin is warm and dry.  Psychiatric: She has a normal mood and affect.  Her behavior is normal.       Assessment:     External hemorrhoid    Plan:     At this point I do not think the external hemorrhoid that I see is large enough to warrant surgery. I have discussed this with her at length and she is in agreement. Certainly if she first up again I would like her to call us right away so that we can evaluate her then. Otherwise we'll see her back on a p.r.n. basis

## 2011-12-11 NOTE — Telephone Encounter (Signed)
ENCOUNTER COMPLETED 

## 2012-01-22 ENCOUNTER — Telehealth: Payer: Self-pay | Admitting: Cardiology

## 2012-01-22 ENCOUNTER — Emergency Department (HOSPITAL_COMMUNITY): Payer: 59

## 2012-01-22 ENCOUNTER — Encounter (HOSPITAL_COMMUNITY): Payer: Self-pay | Admitting: *Deleted

## 2012-01-22 ENCOUNTER — Emergency Department (HOSPITAL_COMMUNITY)
Admission: EM | Admit: 2012-01-22 | Discharge: 2012-01-22 | Disposition: A | Discharge status: Home / Self Care | Payer: 59 | Attending: Emergency Medicine | Admitting: Emergency Medicine

## 2012-01-22 DIAGNOSIS — Z79899 Other long term (current) drug therapy: Secondary | ICD-10-CM | POA: Insufficient documentation

## 2012-01-22 DIAGNOSIS — E119 Type 2 diabetes mellitus without complications: Secondary | ICD-10-CM | POA: Insufficient documentation

## 2012-01-22 DIAGNOSIS — R079 Chest pain, unspecified: Secondary | ICD-10-CM | POA: Insufficient documentation

## 2012-01-22 DIAGNOSIS — K219 Gastro-esophageal reflux disease without esophagitis: Secondary | ICD-10-CM

## 2012-01-22 DIAGNOSIS — E7800 Pure hypercholesterolemia, unspecified: Secondary | ICD-10-CM | POA: Insufficient documentation

## 2012-01-22 DIAGNOSIS — I10 Essential (primary) hypertension: Secondary | ICD-10-CM | POA: Insufficient documentation

## 2012-01-22 DIAGNOSIS — Z853 Personal history of malignant neoplasm of breast: Secondary | ICD-10-CM | POA: Insufficient documentation

## 2012-01-22 LAB — POCT I-STAT, CHEM 8
BUN: 18 mg/dL (ref 6–23)
Calcium, Ion: 1.27 mmol/L (ref 1.12–1.32)
Chloride: 102 mEq/L (ref 96–112)
Creatinine, Ser: 1.3 mg/dL — ABNORMAL HIGH (ref 0.50–1.10)
Glucose, Bld: 163 mg/dL — ABNORMAL HIGH (ref 70–99)

## 2012-01-22 LAB — POCT I-STAT TROPONIN I: Troponin i, poc: 0 ng/mL (ref 0.00–0.08)

## 2012-01-22 MED ORDER — ALUM & MAG HYDROXIDE-SIMETH 200-200-20 MG/5ML PO SUSP
15.0000 mL | Freq: Once | ORAL | Status: AC
  Administered 2012-01-22: 30 mL via ORAL
  Filled 2012-01-22: qty 30

## 2012-01-22 NOTE — ED Provider Notes (Signed)
History     CSN: [id number]  Arrival date & time 01/22/12  1139   First MD Initiated Contact with Patient 01/22/12 1339      Chief Complaint  Patient presents with  . Chest Pain    (Consider location/radiation/quality/duration/timing/severity/associated sxs/prior treatment) HPI Comments: CP in left upper chest area, intermittent lasting about 1 hour over past 3 days, worse after eating.  Improved with exercise.  Pt has had similar in the past, seen by Dr. Patty Sermons, had a neg stress test about 1 year ago.  She was told to take protonix for this.  She began taking protonix once daily 2 days ago, but hasn't fully resolved.  No CP right this moment.  No fever, coughing.  No dizziness, sweats, nausea, SOB.  Pt has sig h/o HTN, DM.  No smoking.  Denies diarrhea.  She has known murmur due to AS and reports that she has been told by Dr. Patty Sermons that her ECG is slightly abn.  No pleurisy, no leg swelling or pain, no recent travel.    The history is provided by the patient.    Past Medical History  Diagnosis Date  . Aortic stenosis   . Hypertension   . Diabetes mellitus   . Hypercholesterolemia   . MVP (mitral valve prolapse)   . Hypertensive cardiovascular disease   . HX: breast cancer   . History of migraines     Past Surgical History  Procedure Date  . Hip fracture surgery 1968  . Tubal ligation 1994  . Mastectomy 2008    bilateral Dr.Streck    Family History  Problem Relation Age of Onset  . Hypertension Mother   . Hypertension Father     History  Substance Use Topics  . Smoking status: Never Smoker   . Smokeless tobacco: Not on file  . Alcohol Use: No    OB History    Grav Para Term Preterm Abortions TAB SAB Ect Mult Living                  Review of Systems  Constitutional: Negative for fever and chills.  HENT: Negative for congestion, sore throat and trouble swallowing.   Respiratory: Positive for chest tightness.   Cardiovascular: Positive for chest  pain. Negative for palpitations and leg swelling.  Gastrointestinal: Negative for nausea, vomiting, abdominal pain and diarrhea.  Musculoskeletal: Negative for back pain.  Neurological: Negative for dizziness.  All other systems reviewed and are negative.    Allergies  Crestor; Iodine; Other; Penicillins; and Pravachol  Home Medications   Current Outpatient Rx  Name Route Sig Dispense Refill  . ASPIRIN EC 81 MG PO TBEC Oral Take 81 mg by mouth daily.    Marland Kitchen BENICAR HCT 40-12.5 MG PO TABS Oral Take 1 tablet by mouth daily. 90 tablet 3    Dispense as written.  Marland Kitchen CALCIUM 600 + D PO Oral Take 2 tablets by mouth daily.     Marland Kitchen METFORMIN HCL ER (MOD) 500 MG PO TB24 Oral Take 500 mg by mouth at bedtime.    Marland Kitchen ONE-DAILY MULTI VITAMINS PO TABS Oral Take 1 tablet by mouth daily.    Marland Kitchen FISH OIL PO Oral Take 3 capsules by mouth daily.     Marland Kitchen ROSUVASTATIN CALCIUM 5 MG PO TABS Oral Take 5 mg by mouth daily.    Marland Kitchen VITAMIN C 500 MG PO TABS Oral Take 500 mg by mouth 2 (two) times daily.      BP 156/82  Pulse 67  Temp(Src) 98.2 F (36.8 C) (Oral)  Resp 18  SpO2 100%  Physical Exam  Nursing note and vitals reviewed. Constitutional: She is oriented to person, place, and time. She appears well-developed and well-nourished. No distress.  HENT:  Head: Normocephalic and atraumatic.  Eyes: Pupils are equal, round, and reactive to light. No scleral icterus.  Neck: Normal range of motion. Neck supple.  Cardiovascular: Normal rate.   Pulmonary/Chest: Effort normal. No respiratory distress. She has no wheezes.  Abdominal: Soft. She exhibits no distension. There is no tenderness. There is no rebound and no guarding.  Neurological: She is alert and oriented to person, place, and time.  Skin: Skin is warm and dry. She is not diaphoretic.  Psychiatric: She has a normal mood and affect.    ED Course  Procedures (including critical care time)  Labs Reviewed  POCT I-STAT, CHEM 8 - Abnormal; Notable for the  following:    Creatinine, Ser 1.30 (*)    Glucose, Bld 163 (*)    Hemoglobin 11.9 (*)    HCT 35.0 (*)    All other components within normal limits  POCT I-STAT TROPONIN I  CBC  DIFFERENTIAL   Dg Chest 2 View  01/22/2012  *RADIOLOGY REPORT*  Clinical Data: Chest pain  CHEST - 2 VIEW  Comparison: Chest radiograph 05/20 1008  Findings: Normal cardiac silhouette.  There is mild central venous congestion.  No effusion, infiltrate, or pneumothorax.  IMPRESSION: Mild central venous pulmonary congestion.  Original Report Authenticated By: Genevive Bi, M.D.     No diagnosis found.  RA sat is 100% and normal.    ECG at time 11:44 shows NSR at rate 63, normal axis, non specific T wave abn's, seen previously and similar to ECG from 01/22/2007.   2:46 PM Pt continues to have no CP, pt understands to continue protonix at home.  I spoke to [facility] cardiology who will arrange an outpt appt for follow up.    MDM  Atypical CP, no change to ECG.  Troponin is normal.  CXR shows slight congestion, but no infiltrate or edema.  I viewed myself and reviewed radiology interpretation.  CP is not exertional and worse with eating suggests this is GI in etiology.  No abd pain or tenderness on exam.  Will d/c home and pt can follow up with Dr. Patty Sermons.  Pt is on protonix already.  Will give some maalox here.          Gavin Pound. Nailani Full, MD 01/22/12 1447

## 2012-01-22 NOTE — ED Notes (Signed)
Pt states left sided cp x 2-3 days with left arm pain  Denies n/v/sob feels urge to burp she states has hx of aortic stenosis and mummer dr Patty Sermons is her cards dr. Andrey Cota has been a little stressed this week. Pt is nurse at [employer]

## 2012-01-22 NOTE — Discharge Instructions (Signed)
Chest Pain (Nonspecific) It is often hard to give a specific diagnosis for the cause of chest pain. There is always a chance that your pain could be related to something serious, such as a heart attack or a blood clot in the lungs. You need to follow up with your caregiver for further evaluation. CAUSES   Heartburn.   Pneumonia or bronchitis.   Anxiety or stress.   Inflammation around your heart (pericarditis) or lung (pleuritis or pleurisy).   A blood clot in the lung.   A collapsed lung (pneumothorax). It can develop suddenly on its own (spontaneous pneumothorax) or from injury (trauma) to the chest.   Shingles infection (herpes zoster virus).  The chest wall is composed of bones, muscles, and cartilage. Any of these can be the source of the pain.  The bones can be bruised by injury.   The muscles or cartilage can be strained by coughing or overwork.   The cartilage can be affected by inflammation and become sore (costochondritis).  DIAGNOSIS  Lab tests or other studies, such as X-rays, electrocardiography, stress testing, or cardiac imaging, may be needed to find the cause of your pain.  TREATMENT   Treatment depends on what may be causing your chest pain. Treatment may include:   Acid blockers for heartburn.   Anti-inflammatory medicine.   Pain medicine for inflammatory conditions.   Antibiotics if an infection is present.   You may be advised to change lifestyle habits. This includes stopping smoking and avoiding alcohol, caffeine, and chocolate.   You may be advised to keep your head raised (elevated) when sleeping. This reduces the chance of acid going backward from your stomach into your esophagus.   Most of the time, nonspecific chest pain will improve within 2 to 3 days with rest and mild pain medicine.  HOME CARE INSTRUCTIONS   If antibiotics were prescribed, take your antibiotics as directed. Finish them even if you start to feel better.   For the next few  days, avoid physical activities that bring on chest pain. Continue physical activities as directed.   Do not smoke.   Avoid drinking alcohol.   Only take over-the-counter or prescription medicine for pain, discomfort, or fever as directed by your caregiver.   Follow your caregiver's suggestions for further testing if your chest pain does not go away.   Keep any follow-up appointments you made. If you do not go to an appointment, you could develop lasting (chronic) problems with pain. If there is any problem keeping an appointment, you must call to reschedule.  SEEK MEDICAL CARE IF:   You think you are having problems from the medicine you are taking. Read your medicine instructions carefully.   Your chest pain does not go away, even after treatment.   You develop a rash with blisters on your chest.  SEEK IMMEDIATE MEDICAL CARE IF:   You have increased chest pain or pain that spreads to your arm, neck, jaw, back, or abdomen.   You develop shortness of breath, an increasing cough, or you are coughing up blood.   You have severe back or abdominal pain, feel nauseous, or vomit.   You develop severe weakness, fainting, or chills.   You have a fever.  THIS IS AN EMERGENCY. Do not wait to see if the pain will go away. Get medical help at once. Call your local emergency services (911 in [place] Do not drive yourself to the hospital. MAKE SURE YOU:   Understand these instructions.     Will watch your condition.   Will get help right away if you are not doing well or get worse.  Document Released: 05/23/2005 Document Revised: 08/02/2011 Document Reviewed: 03/18/2008 Exit[employer] Patient Information 2012 [employer]. 

## 2012-01-22 NOTE — Telephone Encounter (Signed)
Error

## 2012-01-22 NOTE — ED Notes (Signed)
Pt is here with left chest pain and radiates to left arm for the last 3 days.  Pt feels like she needs to burp but cannot

## 2012-01-28 ENCOUNTER — Telehealth: Payer: Self-pay | Admitting: Cardiology

## 2012-02-04 ENCOUNTER — Encounter: Payer: Self-pay | Admitting: Cardiology

## 2012-02-04 ENCOUNTER — Ambulatory Visit (INDEPENDENT_AMBULATORY_CARE_PROVIDER_SITE_OTHER): Payer: 59 | Admitting: Cardiology

## 2012-02-04 VITALS — BP 170/98 | HR 66 | Ht 66.0 in | Wt 223.0 lb

## 2012-02-04 DIAGNOSIS — R1013 Epigastric pain: Secondary | ICD-10-CM

## 2012-02-04 DIAGNOSIS — I359 Nonrheumatic aortic valve disorder, unspecified: Secondary | ICD-10-CM

## 2012-02-04 DIAGNOSIS — K3189 Other diseases of stomach and duodenum: Secondary | ICD-10-CM

## 2012-02-04 DIAGNOSIS — I10 Essential (primary) hypertension: Secondary | ICD-10-CM

## 2012-02-04 DIAGNOSIS — I350 Nonrheumatic aortic (valve) stenosis: Secondary | ICD-10-CM

## 2012-02-04 NOTE — Patient Instructions (Signed)
Your physician has requested that you have an echocardiogram. Echocardiography is a painless test that uses sound waves to create images of your heart. It provides your doctor with information about the size and shape of your heart and how well your heart's chambers and valves are working. This procedure takes approximately one hour. There are no restrictions for this procedure.  Your physician recommends that you continue on your current medications as directed. Please refer to the Current Medication list given to you today.  Your physician wants you to follow-up in: 6 monts You will receive a reminder letter in the mail two months in advance. If you don't receive a letter, please call our office to schedule the follow-up appointment.

## 2012-02-04 NOTE — Assessment & Plan Note (Signed)
Her blood pressure today is high here in the office which is not uncommon for her.  She states that at home and at work or pressure is in the 120 to 1:30 systolic range and the diastolics are normal also.

## 2012-02-04 NOTE — Progress Notes (Signed)
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  She has a history of aortic stenosis.  Her last echocardiogram on 01/30/10 showed mild to moderate aortic stenosis with a peak gradient of 38 and a mean gradient of 21.  The patient recently went to the emergency room with chest pain.  After evaluation there it was felt that it was most likely noncardiac and possibly related to a hiatal hernia.  She had stopped taking her proton X. and is now back on Protonix and also uses occasional Maalox and her symptoms appear to be improving.  Her symptoms are worse after eating and better if she is able to burp.  Reviewed her electrocardiograms and they have not changed since 2005.  Her blood pressure at our office is elevated but when she checks it at work he is in the normal range.  The patient has been under more stress because her 65 year old mother with dementia is now living with her.  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  The patient has a history of diabetes mellitus followed by Dr. Cleon Gustin and she has a history of sleep apnea and uses a CPAP machine.  She is on Crestor followed by Dr. Cleon Gustin.  Current Outpatient Prescriptions  Medication Sig Dispense Refill  . aspirin EC 81 MG tablet Take 81 mg by mouth daily.      Marland Kitchen BENICAR HCT 40-12.5 MG per tablet Take 1 tablet by mouth daily.  90 tablet  3  . Calcium Carbonate-Vitamin D (CALCIUM 600 + D PO) Take 2 tablets by mouth daily.       . Cholecalciferol (VITAMIN D) 2000 UNITS CAPS Take by mouth daily.      . metFORMIN (GLUMETZA) 500 MG (MOD) 24 hr tablet Take 500 mg by mouth at bedtime.      . Multiple Vitamin (MULTIVITAMIN) tablet Take  1 tablet by mouth daily.      . Omega-3 Fatty Acids (FISH OIL PO) Take 3 capsules by mouth daily.       . pantoprazole (PROTONIX) 40 MG tablet Take 40 mg by mouth daily.      . rosuvastatin (CRESTOR) 5 MG tablet Take 5 mg by mouth daily.      . vitamin C (ASCORBIC ACID) 500 MG tablet Take 500 mg by mouth 2 (two) times daily.        Allergies  Allergen Reactions  . Crestor (Rosuvastatin Calcium)     Myalgias (high doses)  . Iodine Other (See Comments)    unknown  . Other     Shell fish  . Penicillins Hives and Itching  . Pravachol     Muscle pain    Patient Active Problem List  Diagnoses  . DIABETES MELLITUS, TYPE II  . HYPERTENSION  . AORTIC STENOSIS  . Unspecified disorders of bursae and tendons in shoulder region  . Aortic stenosis  . Hypercholesterolemia  . MVP (mitral valve prolapse)  . Hypertensive cardiovascular disease  . HX: breast cancer  . History of migraines  . External hemorrhoid    History  Smoking status  . Never Smoker   Smokeless tobacco  . Not on file  History  Alcohol Use No    Family History  Problem Relation Age of Onset  . Hypertension Mother   . Hypertension Father     Review of Systems: Constitutional: no fever chills diaphoresis or fatigue or change in weight.  Head and neck: no hearing loss, no epistaxis, no photophobia or visual disturbance. Respiratory: No cough, shortness of breath or wheezing. Cardiovascular: No chest pain peripheral edema, palpitations. Gastrointestinal: No abdominal distention, no abdominal pain, no change in bowel habits hematochezia or melena. Genitourinary: No dysuria, no frequency, no urgency, no nocturia. Musculoskeletal:No arthralgias, no back pain, no gait disturbance or myalgias. Neurological: No dizziness, no headaches, no numbness, no seizures, no syncope, no weakness, no tremors. Hematologic: No lymphadenopathy, no easy bruising. Psychiatric: No confusion, no hallucinations, no sleep  disturbance.    Physical Exam: Filed Vitals:   02/04/12 1448  BP: 170/98  Pulse: 66   the general appearance reveals a well-developed well-nourished slightly overweight woman in no distress.The head and neck exam reveals pupils equal and reactive.  Extraocular movements are full.  There is no scleral icterus.  The mouth and pharynx are normal.  The neck is supple.  The carotids reveal no bruits.  The jugular venous pressure is normal.  The  thyroid is not enlarged.  There is no lymphadenopathy.  The chest is clear to percussion and auscultation.  There are no rales or rhonchi.  Expansion of the chest is symmetrical.  The precordium is quiet.  The first heart sound is normal.  The second heart sound is physiologically split.  There is no gallop rub or click.  There is a grade 3-6 systolic ejection murmur at the base. There is no abnormal lift or heave.  The abdomen is soft and nontender.  The bowel sounds are normal.  The liver and spleen are not enlarged.  There are no abdominal masses.  There are no abdominal bruits.  Extremities reveal good pedal pulses.  There is no phlebitis or edema.  There is no cyanosis or clubbing.  Strength is normal and symmetrical in all extremities.  There is no lateralizing weakness.  There are no sensory deficits.  The skin is warm and dry.  There is no rash.  I reviewed her electrocardiogram done at the emergency room on 01/22/12.  It shows lateral wall T-wave inversions which are unchanged since 2005.   Assessment / Plan: The patient is to continue same medication.  She will return for an echocardiogram.  Recheck in 6 months

## 2012-02-04 NOTE — Assessment & Plan Note (Signed)
Her symptoms of chest pain are suggestive of dyspepsia and possible hiatal hernia or GERD.  She will continue with her Protonix and use Maalox or TUMS when necessary

## 2012-02-04 NOTE — Assessment & Plan Note (Signed)
The patient has known aortic stenosis.  It does not sound as if her symptoms have significantly changed since last visit.  Her last echocardiogram was more than 2 years ago and we will have her return for a update on her 2-D echo.

## 2012-02-06 ENCOUNTER — Telehealth: Payer: Self-pay | Admitting: Cardiology

## 2012-02-06 NOTE — Telephone Encounter (Signed)
New msg Pt wants to talk to you about her BP 172/67 pulse 62

## 2012-02-06 NOTE — Telephone Encounter (Signed)
Left message, will try again before I leave

## 2012-02-11 ENCOUNTER — Ambulatory Visit (HOSPITAL_COMMUNITY): Payer: 59 | Attending: Cardiology

## 2012-02-11 ENCOUNTER — Telehealth: Payer: Self-pay | Admitting: Cardiology

## 2012-02-11 DIAGNOSIS — C50919 Malignant neoplasm of unspecified site of unspecified female breast: Secondary | ICD-10-CM | POA: Insufficient documentation

## 2012-02-11 DIAGNOSIS — I10 Essential (primary) hypertension: Secondary | ICD-10-CM | POA: Insufficient documentation

## 2012-02-11 DIAGNOSIS — R079 Chest pain, unspecified: Secondary | ICD-10-CM | POA: Insufficient documentation

## 2012-02-11 DIAGNOSIS — Z9010 Acquired absence of unspecified breast and nipple: Secondary | ICD-10-CM | POA: Insufficient documentation

## 2012-02-11 DIAGNOSIS — G4730 Sleep apnea, unspecified: Secondary | ICD-10-CM | POA: Insufficient documentation

## 2012-02-11 DIAGNOSIS — I359 Nonrheumatic aortic valve disorder, unspecified: Secondary | ICD-10-CM | POA: Insufficient documentation

## 2012-02-11 DIAGNOSIS — E119 Type 2 diabetes mellitus without complications: Secondary | ICD-10-CM | POA: Insufficient documentation

## 2012-02-11 DIAGNOSIS — I517 Cardiomegaly: Secondary | ICD-10-CM | POA: Insufficient documentation

## 2012-02-11 DIAGNOSIS — I350 Nonrheumatic aortic (valve) stenosis: Secondary | ICD-10-CM

## 2012-02-11 NOTE — Telephone Encounter (Signed)
Pt calling re getting the wrong benacar samples, pls call [phone]

## 2012-02-11 NOTE — Telephone Encounter (Signed)
Will leave samples out front front for patient

## 2012-02-11 NOTE — Progress Notes (Signed)
Echocardiogram performed.  

## 2012-02-22 NOTE — Telephone Encounter (Signed)
Patient never called back.

## 2012-05-15 ENCOUNTER — Encounter: Payer: Self-pay | Admitting: Sports Medicine

## 2012-05-15 ENCOUNTER — Ambulatory Visit (INDEPENDENT_AMBULATORY_CARE_PROVIDER_SITE_OTHER): Payer: 59 | Admitting: Sports Medicine

## 2012-05-15 ENCOUNTER — Ambulatory Visit
Admission: RE | Admit: 2012-05-15 | Discharge: 2012-05-15 | Disposition: A | Discharge status: Home / Self Care | Payer: 59 | Source: Ambulatory Visit | Attending: Sports Medicine | Admitting: Sports Medicine

## 2012-05-15 VITALS — BP 163/96 | HR 68 | Ht 66.0 in | Wt 223.0 lb

## 2012-05-15 DIAGNOSIS — M25519 Pain in unspecified shoulder: Secondary | ICD-10-CM

## 2012-05-15 DIAGNOSIS — M25512 Pain in left shoulder: Secondary | ICD-10-CM

## 2012-05-15 MED ORDER — IBUPROFEN 800 MG PO TABS: 800.0000 mg | ORAL_TABLET | Freq: Three times a day (TID) | ORAL | Status: DC | PRN

## 2012-05-15 MED ORDER — TRAMADOL HCL 50 MG PO TABS: 50.0000 mg | ORAL_TABLET | Freq: Three times a day (TID) | ORAL | Status: DC | PRN

## 2012-05-15 NOTE — Assessment & Plan Note (Addendum)
Some spurring and capsular thickening of shoulder, suspicious for arthritis. Shoulder films ordered for further evaluation She is getting some impingement from the spurs seen on ultrasound, however there are no tears seen today. She may continue her 800mg  ibuprofen, tramadol also added for pain control Given light exercises to increase ROM and strengthening Will call with results of xrays  Xray review shows calcific spurs under acromium and on the coracoid - correspond to those seen on [place] Suspect these may be giving chronic irritation to supraspinatus tendon   We will reck this in 4 weeks and see if she is getting improvement

## 2012-05-15 NOTE — Progress Notes (Signed)
  Subjective:    Patient ID: [name], female    DOB: 1951/08/21, 61 y.o.   MRN: [id number]  HPI 1.  Shoulder pain:  Here with complaint of L shoulder pain.  Had injury in October 2009 where she injured L shoulder, found to have bursitis and some calcific changes but no tear of any rotator cuff muscles.  Was doing better now she feels like she has "spasms" of the shoulder for the past 4.5 months.  She states this pain feels different than the pain she had in 2009-10.  Points to area along the posterior shoulder as area of most pain. She denies any neck pain or radiation of pain down the arm.    She works as an Scientist, forensic.  She relates pain to trying to move or assist with moving heavier patients in the OR. No other known injury.   Review of Systems Per HPI    Objective:   Physical Exam  Constitutional: She appears well-nourished. No distress.  Musculoskeletal:       Shoulder: Inspection reveals no abnormalities, atrophy or asymmetry. Palpation is normal with no tenderness over AC joint or bicipital groove. ROM is mildly limited in overhead movement but full in all other planes. Rotator cuff strength decreased with testing of infraspinatus.  Otherwise normal throughout. No signs of impingement with negative Neer and Hawkin's tests.  Positive empty can. Speeds and Yergason's tests normal. Obrien's with pain. Negative clunk and good stability. Normal scapular function observed. No painful arc and no drop arm sign. No apprehension sign Positive scarf sign  Neurological: She is alert.    [facility] ultrasound:  There is some spurring seen around the humeral head with some thickening of the capsule.  There are calcific changes present in the supraspinatus with small spur at attachment to humeral head.   Infrapinatus appears intact.  The subscapularis is intact but there is impingement between one of the spurs seen and the coracoid.  Teres minor intact.        Assessment & Plan:

## 2012-05-16 ENCOUNTER — Telehealth: Payer: Self-pay | Admitting: *Deleted

## 2012-05-16 NOTE — Telephone Encounter (Signed)
Message copied by Mora Bellman on Fri May 16, 2012 11:52 AM ------      Message from: Enid Baas      Created: Thu May 15, 2012  9:42 PM       Please call and let her know.            Xray shows no arthritis.      She does have some calcific spurs just like we saw on ultrasound that may be pinching against her rotator cuff tendons.            Try the exercises and tramadol for 1 month and then let me recheck.

## 2012-05-16 NOTE — Telephone Encounter (Signed)
Called pt- gave her x-ray results.

## 2012-08-11 ENCOUNTER — Telehealth: Payer: Self-pay | Admitting: Cardiology

## 2012-08-11 NOTE — Telephone Encounter (Signed)
New problem:   Need an note from Dr. Patty Sermons stating she is not to take this flu shot. The deadline is today.

## 2012-08-11 NOTE — Telephone Encounter (Signed)
Tried to fax and number incorrect, patient coming to pick up signed Rx

## 2012-09-29 ENCOUNTER — Emergency Department (HOSPITAL_BASED_OUTPATIENT_CLINIC_OR_DEPARTMENT_OTHER)
Admission: EM | Admit: 2012-09-29 | Discharge: 2012-09-29 | Disposition: A | Discharge status: Home / Self Care | Payer: 59 | Attending: Emergency Medicine | Admitting: Emergency Medicine

## 2012-09-29 ENCOUNTER — Emergency Department (HOSPITAL_BASED_OUTPATIENT_CLINIC_OR_DEPARTMENT_OTHER): Payer: 59

## 2012-09-29 ENCOUNTER — Encounter (HOSPITAL_BASED_OUTPATIENT_CLINIC_OR_DEPARTMENT_OTHER): Payer: Self-pay | Admitting: *Deleted

## 2012-09-29 DIAGNOSIS — E119 Type 2 diabetes mellitus without complications: Secondary | ICD-10-CM | POA: Insufficient documentation

## 2012-09-29 DIAGNOSIS — S93402A Sprain of unspecified ligament of left ankle, initial encounter: Secondary | ICD-10-CM

## 2012-09-29 DIAGNOSIS — Z79899 Other long term (current) drug therapy: Secondary | ICD-10-CM | POA: Insufficient documentation

## 2012-09-29 DIAGNOSIS — E7800 Pure hypercholesterolemia, unspecified: Secondary | ICD-10-CM | POA: Insufficient documentation

## 2012-09-29 DIAGNOSIS — IMO0002 Reserved for concepts with insufficient information to code with codable children: Secondary | ICD-10-CM | POA: Insufficient documentation

## 2012-09-29 DIAGNOSIS — I119 Hypertensive heart disease without heart failure: Secondary | ICD-10-CM | POA: Insufficient documentation

## 2012-09-29 DIAGNOSIS — Y9301 Activity, walking, marching and hiking: Secondary | ICD-10-CM | POA: Insufficient documentation

## 2012-09-29 DIAGNOSIS — Z853 Personal history of malignant neoplasm of breast: Secondary | ICD-10-CM | POA: Insufficient documentation

## 2012-09-29 DIAGNOSIS — S93409A Sprain of unspecified ligament of unspecified ankle, initial encounter: Secondary | ICD-10-CM | POA: Insufficient documentation

## 2012-09-29 DIAGNOSIS — R296 Repeated falls: Secondary | ICD-10-CM | POA: Insufficient documentation

## 2012-09-29 DIAGNOSIS — Y9289 Other specified places as the place of occurrence of the external cause: Secondary | ICD-10-CM | POA: Insufficient documentation

## 2012-09-29 DIAGNOSIS — Z7982 Long term (current) use of aspirin: Secondary | ICD-10-CM | POA: Insufficient documentation

## 2012-09-29 DIAGNOSIS — Z8679 Personal history of other diseases of the circulatory system: Secondary | ICD-10-CM | POA: Insufficient documentation

## 2012-09-29 MED ORDER — IBUPROFEN 600 MG PO TABS: 600.0000 mg | ORAL_TABLET | Freq: Four times a day (QID) | ORAL | Status: DC | PRN

## 2012-09-29 MED ORDER — IBUPROFEN 400 MG PO TABS: 600.0000 mg | ORAL_TABLET | Freq: Once | ORAL | Status: DC

## 2012-09-29 MED ORDER — TRAMADOL HCL 50 MG PO TABS: 50.0000 mg | ORAL_TABLET | Freq: Once | ORAL | Status: DC

## 2012-09-29 MED ORDER — TRAMADOL HCL 50 MG PO TABS: 50.0000 mg | ORAL_TABLET | Freq: Four times a day (QID) | ORAL | Status: DC | PRN

## 2012-09-29 NOTE — ED Notes (Signed)
[name] on an escalator when she was trying to catch her mother. C.o pain to her left ankle. Laceration to her left knee and abrasion to her left 2nd 3rd and 4th knuckles.

## 2012-09-29 NOTE — ED Provider Notes (Signed)
History  This chart was scribed for Loren Racer, MD by Shari Heritage, ED Scribe. The patient was seen in room MH10/MH10. Patient's care was started at 1544.   CSN: [id number]  Arrival date & time 09/29/12  1440   First MD Initiated Contact with Patient 09/29/12 1544      Chief Complaint  Patient presents with  . Fall     Patient is a 62 y.o. female presenting with fall. The history is provided by the patient. No language interpreter was used.  Fall The accident occurred 1 to 2 hours ago. The fall occurred while walking. She landed on a hard floor. There was no blood loss. The pain is moderate. There was no entrapment after the fall. There was no drug use involved in the accident. There was no alcohol use involved in the accident. Pertinent negatives include no headaches. She has tried nothing for the symptoms.     HPI Comments: [name] is a 62 y.o. female who presents to the Emergency Department complaining of moderate, constant, non-radiating left ankle pain resulting from torsion during a mechanical fall that occurred 1-2 hours ago. There is associated swelling to left ankle. Patient is also complaining of mild soreness to left shoulder and mild abrasions of the left hand and knee. Patient states that she fell while traveling on an escalator during a shopping trip. She denies hitting her head. She has a medical history of HTN, aortic stenosis, diabetes, MVP, breast cancer and migraines. Patient is allergic to shellfish, penicillin and ampicillin.    Past Medical History  Diagnosis Date  . Aortic stenosis   . Hypertension   . Diabetes mellitus   . Hypercholesterolemia   . MVP (mitral valve prolapse)   . Hypertensive cardiovascular disease   . HX: breast cancer   . History of migraines     Past Surgical History  Procedure Date  . Hip fracture surgery 1968  . Tubal ligation 1994  . Mastectomy 2008    bilateral Dr.Streck    Family History  Problem Relation  Age of Onset  . Hypertension Mother   . Hypertension Father     History  Substance Use Topics  . Smoking status: Never Smoker   . Smokeless tobacco: Not on file  . Alcohol Use: No    OB History    Grav Para Term Preterm Abortions TAB SAB Ect Mult Living                  Review of Systems  Musculoskeletal: Positive for myalgias.  Skin: Positive for wound.  Neurological: Negative for headaches.  All other systems reviewed and are negative.    Allergies  Crestor; Iodine; Other; Penicillins; and Pravachol  Home Medications   Current Outpatient Rx  Name  Route  Sig  Dispense  Refill  . ASPIRIN EC 81 MG PO TBEC   Oral   Take 81 mg by mouth daily.         Marland Kitchen BENICAR HCT 40-12.5 MG PO TABS   Oral   Take 1 tablet by mouth daily.   90 tablet   3     Dispense as written.   Marland Kitchen CALCIUM 600 + D PO   Oral   Take 2 tablets by mouth daily.          Marland Kitchen VITAMIN D 2000 UNITS PO CAPS   Oral   Take by mouth daily.         . IBUPROFEN 600 MG PO TABS  Oral   Take 1 tablet (600 mg total) by mouth every 6 (six) hours as needed for pain.   30 tablet   0   . IBUPROFEN 800 MG PO TABS   Oral   Take 1 tablet (800 mg total) by mouth every 8 (eight) hours as needed for pain.   45 tablet   0   . METFORMIN HCL ER (MOD) 500 MG PO TB24   Oral   Take 500 mg by mouth at bedtime.         Marland Kitchen ONE-DAILY MULTI VITAMINS PO TABS   Oral   Take 1 tablet by mouth daily.         Marland Kitchen FISH OIL PO   Oral   Take 3 capsules by mouth daily.          Marland Kitchen PANTOPRAZOLE SODIUM 40 MG PO TBEC   Oral   Take 40 mg by mouth daily.         Marland Kitchen ROSUVASTATIN CALCIUM 5 MG PO TABS   Oral   Take 5 mg by mouth daily.         . TRAMADOL HCL 50 MG PO TABS   Oral   Take 1 tablet (50 mg total) by mouth every 8 (eight) hours as needed for pain.   90 tablet   1   . TRAMADOL HCL 50 MG PO TABS   Oral   Take 1 tablet (50 mg total) by mouth every 6 (six) hours as needed for pain.   15 tablet    0   . VITAMIN C 500 MG PO TABS   Oral   Take 500 mg by mouth 2 (two) times daily.           BP 142/88  Pulse 65  Temp 98.3 F (36.8 C) (Oral)  Resp 16  Ht 5\' 5"  (1.651 m)  Wt 220 lb (99.791 kg)  BMI 36.61 kg/m2  SpO2 100%  Physical Exam  Constitutional: She is oriented to person, place, and time. She appears well-developed and well-nourished. No distress.  HENT:  Head: Normocephalic and atraumatic.  Mouth/Throat: Oropharynx is clear and moist.  Eyes: Conjunctivae normal and EOM are normal. Pupils are equal, round, and reactive to light.  Neck: Normal range of motion. Neck supple.  Cardiovascular: Normal rate, regular rhythm and normal heart sounds.   Pulmonary/Chest: Effort normal and breath sounds normal. No respiratory distress.  Abdominal: Soft. Bowel sounds are normal. She exhibits no distension. There is no tenderness.  Musculoskeletal: Normal range of motion. She exhibits tenderness.       Left ankle: She exhibits swelling. tenderness.       Swelling and tenderness to left ankle posterior to the lateral malleolus. 2+ DP pulses. Sensation intact. Full ROM at the knee. Mild abrasions of the knee. No C spinal tenderness. Neurovascularly intact. No other injuries.  Neurological: She is alert and oriented to person, place, and time.  Skin: Skin is warm and dry.  Psychiatric: She has a normal mood and affect. Her behavior is normal.    ED Course  Procedures (including critical care time) DIAGNOSTIC STUDIES: Oxygen Saturation is 100% on room air, normal by my interpretation.    COORDINATION OF CARE: 4:14 PM- Patient informed of current plan for treatment and evaluation and agrees with plan at this time. X-ray is negative for fracture, shows soft tissue swelling. Will give ibuprofen and tramadol and order ace wrap and crutches. Have instructed patient to allow her ankle to rest  before use.. Will provide a work note.   Dg Ankle Complete Left  09/29/2012  *RADIOLOGY REPORT*   Clinical Data: Fall.  Left ankle pain.  LEFT ANKLE COMPLETE - 3+ VIEW  Comparison: None.  Findings: Mild irregularity of the inferior tip of the fibula noted.  There is mild spurring of the medial malleolus.  Plafond and talar dome appear intact.  Plantar and Achilles calcaneal spurs noted.  Mild dorsal spurring along the medial cuneiform. Mild soft tissue swelling is present below the malleoli.  IMPRESSION:  1.  Soft tissue swelling below the malleoli. 2.  Mild spurring along the inferior margins of the malleoli.  No definite fracture. 3.  Plantar and Achilles calcaneal spurs.   Original Report Authenticated By: Gaylyn Rong, M.D.      1. Left ankle sprain       MDM  I personally performed the services described in this documentation, which was scribed in my presence. The recorded information has been reviewed and is accurate.    Loren Racer, MD 09/29/12 2258

## 2012-10-01 ENCOUNTER — Other Ambulatory Visit: Payer: Self-pay | Admitting: *Deleted

## 2012-10-01 ENCOUNTER — Telehealth: Payer: Self-pay | Admitting: Cardiology

## 2012-10-01 MED ORDER — BENICAR HCT 40-12.5 MG PO TABS: 1.0000 | ORAL_TABLET | Freq: Every day | ORAL | Status: DC

## 2012-10-01 NOTE — Telephone Encounter (Signed)
Follow-up:    Wanted to leave her new cell phone number.

## 2012-10-01 NOTE — Telephone Encounter (Signed)
New problem:   Samples of benicar hct  40-12.5 mg tablet

## 2012-10-01 NOTE — Telephone Encounter (Signed)
Patient will be out too early of her Benicar, called pharmacy to let them know she is ok to get early (took extra). Patient stated someone had advised her to do that at some point secondary to her blood pressure being up

## 2012-10-20 ENCOUNTER — Encounter: Payer: Self-pay | Admitting: Family Medicine

## 2012-10-20 ENCOUNTER — Ambulatory Visit (INDEPENDENT_AMBULATORY_CARE_PROVIDER_SITE_OTHER): Payer: 59 | Admitting: Family Medicine

## 2012-10-20 ENCOUNTER — Ambulatory Visit (INDEPENDENT_AMBULATORY_CARE_PROVIDER_SITE_OTHER): Payer: 59 | Admitting: Cardiology

## 2012-10-20 ENCOUNTER — Encounter: Payer: Self-pay | Admitting: Cardiology

## 2012-10-20 VITALS — BP 158/79 | HR 71 | Ht 65.5 in | Wt 223.0 lb

## 2012-10-20 VITALS — BP 152/78 | HR 60 | Ht 65.5 in | Wt 223.4 lb

## 2012-10-20 DIAGNOSIS — I350 Nonrheumatic aortic (valve) stenosis: Secondary | ICD-10-CM

## 2012-10-20 DIAGNOSIS — I119 Hypertensive heart disease without heart failure: Secondary | ICD-10-CM

## 2012-10-20 DIAGNOSIS — M25512 Pain in left shoulder: Secondary | ICD-10-CM

## 2012-10-20 DIAGNOSIS — E7800 Pure hypercholesterolemia, unspecified: Secondary | ICD-10-CM

## 2012-10-20 MED ORDER — BENICAR HCT 40-12.5 MG PO TABS: ORAL_TABLET | ORAL | Status: DC

## 2012-10-20 NOTE — Assessment & Plan Note (Signed)
Her blood pressure has been running higher.  She has been on some nonsteroidal anti-inflammatory agents which may be contributing to this.  She is also been eating some [place] food which seemed to aggravate her high blood pressure.  He increased her Benicar HCT up to 1-1/2 tablets daily and has felt improved and her blood pressure has improved.  Her weight is unchanged from 6 months ago.

## 2012-10-20 NOTE — Assessment & Plan Note (Signed)
She is having noncardiac pain in the left shoulder.  She has been told that she may have a partially torn rotator cuff.
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
## 2012-10-20 NOTE — Patient Instructions (Addendum)
Increase you Benicar to 1 and 1/2 tablets daily, Rx sent to [facility] pharmacy  Your physician wants you to follow-up in: 6 month ov/ekg You will receive a reminder letter in the mail two months in advance. If you don't receive a letter, please call our office to schedule the follow-up appointment.

## 2012-10-20 NOTE — Assessment & Plan Note (Signed)
Her aortic stenosis remains moderate.  She has not had any symptoms of angina or congestive heart failure.  Dizziness or syncope.  Her blood pressure at home has been normal

## 2012-10-21 NOTE — Progress Notes (Signed)
  Subjective:    Patient ID: [name], female    DOB: 1951/05/01, 62 y.o.   MRN: [id number]  HPI Left shoulder pain Was seen in September for shoulder pain, diagnosed with some impingement and rotator cuff syndrome. She had been doing exercises and doing really well until she fell 2 weeks ago. She evidently injured her left ankle and has been may sleep preoccupied with that but is it healed she started to notice that her left shoulder was bothering her more and more. Pain is mostly in the trapezius area. Saw her cardiologist today who recommended she be evaluated. Pain is aching 4-6/10, worse with overhead movement or any attempt to reach backward. He is keeping her awake at night. He's having a lot of spasm in the trapezius which is intermittent and sharp. Denies numbness or tingling in the arm or hand. No weakness.  REPORT from [facility] ultrasound in September 2014: There is some spurring seen around the humeral head with some thickening of the capsule. There are calcific changes present in the supraspinatus with small spur at attachment to humeral head. Infrapinatus appears intact. The subscapularis is intact but there is impingement between one of the spurs seen and the coracoid. Teres minor intact    Review of Systems See history of present illness above.    Objective:   Physical Exam Vital signs are reviewed GENERAL: Well-developed overweight female no acute distress SHOULDER: Left. Full range of motion in all planes the rotator cuff. Strength is intact although she has pain with supraspinatus testing as well as liftoff test. Mild tenderness to palpation in the biceps tendon area. Significant spasm in tenderness noted in the left trapezius.  INJECTION: Patient was given informed consent, signed copy in the chart. Appropriate time out was taken. Area prepped and draped in usual sterile fashion. 1 cc of methylprednisolone 40 mg/ml plus  4 cc of 1% lidocaine without epinephrine  was injected into the left subacromial bursa using a(n) posterior approach. The patient tolerated the procedure well. There were no complications. Post procedure instructions were given.       Assessment & Plan:  #1 pre-shoulder pain. I think she's aggravated her tendinitis and rotator cuff with her fall. Does not seem from exam that she has an overt tear. She's been out of work several days with her ankle and she would like to get back to work so she chose corticosteroid injection today. Also gave her prescription for therapeutic massages that was beneficial to her last time. She will restart the exercises in a few days. Followup if she's not improving.

## 2012-11-07 ENCOUNTER — Telehealth: Payer: Self-pay | Admitting: *Deleted

## 2012-11-07 NOTE — Telephone Encounter (Signed)
Message left on our office voicemail.  Lorie received referral and has questions about patient's treatment plan.  Returned call and left message to call our office back.  Gaylene Brooks, RN

## 2012-11-07 NOTE — Telephone Encounter (Signed)
Pt states she sees Dr. Darrick Penna and it is covered by worker's comp.  Wants to know if PT will be covered.  Advised will need to check with worker's comp case manager and let her know.

## 2012-11-07 NOTE — Telephone Encounter (Signed)
Lorie calling back from [facility].  Per Lorie--Patient stated shoulder injury was due to a "work-related injury."  Unsure if this is a Worker's Comp case.  If so, it will change billing process because treatment would need to be filed with [employer].  Patient has a $3000 deductible for their office since they are out of network.  Patient has not had PT yet.  Would like to discuss treatment plan/options with Dr. Jennette Kettle.  Will route note to Dr. Jennette Kettle to call Lorie back.  Gaylene Brooks, RN

## 2012-11-07 NOTE — Telephone Encounter (Signed)
She did not come to [place] as a Worker's Comp so I know nothing about that. If she hurt it at work, then she needs to straighten that oout. Why is she going to a PT who is out of network? THANKS! Denny Levy

## 2012-11-11 NOTE — Telephone Encounter (Addendum)
Advised pt that she should check with her worker's comp case manager to determine if PT will be covered, and who they suggest that she go to.  Asked her to call back if she needs any further assistance.

## 2012-11-20 ENCOUNTER — Telehealth: Payer: Self-pay | Admitting: Family Medicine

## 2012-11-20 NOTE — Telephone Encounter (Signed)
Message copied by Denny Levy L on Thu Nov 20, 2012  2:18 PM ------      Message from: [place], [place] C      Created: Wed Nov 19, 2012  4:50 PM       Pt is requesting percocet because pain is getting worse and insurance will not pay for massage therapy ------

## 2012-11-20 NOTE — Telephone Encounter (Signed)
Spoke with pt- advised Dr. Jennette Kettle will not refill percocet.  Offered her a f/u appt.  She declined an appt with Dr. Jennette Kettle at this time, but will call back if she would like to schedule one.

## 2012-11-20 NOTE — Telephone Encounter (Signed)
Neeton or Amy Plz tell her that I do not Rx narcotics for this---if she wants to come back for another visit we can discuss other options such as further imaging or surgery for her shoulder-. THANKS!\ Denny Levy

## 2013-05-15 ENCOUNTER — Encounter: Payer: Self-pay | Admitting: Cardiology

## 2013-05-18 ENCOUNTER — Encounter: Payer: Self-pay | Admitting: Cardiology

## 2013-06-24 ENCOUNTER — Telehealth: Payer: Self-pay | Admitting: Cardiology

## 2013-06-24 NOTE — Telephone Encounter (Signed)
Walk in pt Form " Vaccination Exemption" paper Dropped off Will hold till Muse back Thursday 06/24/13/Km

## 2013-08-18 ENCOUNTER — Encounter: Payer: Self-pay | Admitting: Sports Medicine

## 2013-08-18 ENCOUNTER — Ambulatory Visit (INDEPENDENT_AMBULATORY_CARE_PROVIDER_SITE_OTHER): Payer: 59 | Admitting: Sports Medicine

## 2013-08-18 VITALS — BP 191/94 | Ht 65.0 in | Wt 221.0 lb

## 2013-08-18 DIAGNOSIS — IMO0002 Reserved for concepts with insufficient information to code with codable children: Secondary | ICD-10-CM

## 2013-08-18 DIAGNOSIS — M25561 Pain in right knee: Secondary | ICD-10-CM | POA: Insufficient documentation

## 2013-08-18 DIAGNOSIS — M7650 Patellar tendinitis, unspecified knee: Secondary | ICD-10-CM | POA: Insufficient documentation

## 2013-08-18 DIAGNOSIS — M25569 Pain in unspecified knee: Secondary | ICD-10-CM

## 2013-08-18 DIAGNOSIS — M7651 Patellar tendinitis, right knee: Secondary | ICD-10-CM

## 2013-08-18 DIAGNOSIS — S83241A Other tear of medial meniscus, current injury, right knee, initial encounter: Secondary | ICD-10-CM | POA: Insufficient documentation

## 2013-08-18 MED ORDER — METHYLPREDNISOLONE ACETATE 40 MG/ML IJ SUSP
40.0000 mg | Freq: Once | INTRAMUSCULAR | Status: AC
  Administered 2013-08-18: 40 mg via INTRA_ARTICULAR

## 2013-08-18 NOTE — Assessment & Plan Note (Addendum)
Secondary to trauma. Steroid injection applied to relive symptoms. Knee brace ICE.   Straight leg raise exercises. F/u in 1 week

## 2013-08-18 NOTE — Assessment & Plan Note (Signed)
I am concerned that she may have a partial tear of patellar tendon due to US findings and difficulty extending the lower leg  Needs to stay out of work this week  Reck and repeat US in 1 week  If still tender would start NTG

## 2013-08-18 NOTE — Progress Notes (Signed)
Sports Medicine Office Visit Note   Subjective:   Patient ID: [name], female  DOB: Apr 09, 1951, 62 y.o.. MRN: [id number]   Pt that comes today complaining of right knee pain. 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 She was transferring a pt and "banged" her knee frontally with the pt's bed. She then describes the pain started in the mid aspect of knee and below patella. Reports pain has aggravated and now wakes her up at night. Even driving has become in issue since she has to brake with her left foot. Pt has noticed mild swelling but no erythema or bruising  Now patient has pain along the medial joint as well.  Review of Systems:  Denies fever, chills, nausea vomiting or or other systemic symptoms  Objective:   Physical Exam: Gen:  Obese, NAD BP 191/94  Ht 5\' 5"  (1.651 m)  Wt 221 lb (100.245 kg)  BMI 36.78 kg/m2  MSK: right knee with mild effusion, no deformity, erythema mild warmth. Crepitus present on flexion/extension. ROM limited by pain. Tenderness present below patella and on medial  joint line. No laxity.  Ligaments intact McMurray is too painful for patient and most pain on medial aspect Neurovascular intact.   MSK R Knee Ultrasound:  Patellar tendon is hypoechoic in the medial 30% of tendon at insertion to patella Distally intact Increased doppler flow Medial joint line with some spurring Calcified fragment of medial meniscus with hypoechoic change Large suprapatellar pouch effusion up 7 cm into quad above patella   Assessment & Plan:    Knee Injection Procedure Note  Verbal consent was obtained for the procedure. The joint was prepped with isopropyl alcohol. An ultrasound guided 25 gauge needle was inserted into the superior aspect of the joint.  4 ml 1% lidocaine and 1 ml DepoMedrol 40 was injected.The needle was removed and the area cleansed and dressed.  Complications:  None; patient  tolerated the procedure well.

## 2013-08-18 NOTE — Assessment & Plan Note (Signed)
Once she is more comfortable we need to follow up with some standing knee films to see how much DJD  This is not likely to be surgical with her other issues but we will start with brace today to see if she can walk better  Large effusion today and we should recheck that next week as well  OOW this week/ will see Dr Margaretha Sheffield next week

## 2013-08-18 NOTE — Patient Instructions (Signed)
Please use Ice Three times day for 20 min. We recommend straight leg exercises.  F/u next week.

## 2013-08-25 ENCOUNTER — Ambulatory Visit (INDEPENDENT_AMBULATORY_CARE_PROVIDER_SITE_OTHER): Payer: PRIVATE HEALTH INSURANCE | Admitting: Sports Medicine

## 2013-08-25 ENCOUNTER — Encounter: Payer: Self-pay | Admitting: Sports Medicine

## 2013-08-25 VITALS — BP 146/81 | HR 62 | Ht 65.0 in | Wt 221.0 lb

## 2013-08-25 DIAGNOSIS — M25569 Pain in unspecified knee: Secondary | ICD-10-CM

## 2013-08-25 DIAGNOSIS — M25561 Pain in right knee: Secondary | ICD-10-CM

## 2013-08-25 DIAGNOSIS — S83241A Other tear of medial meniscus, current injury, right knee, initial encounter: Secondary | ICD-10-CM

## 2013-08-25 DIAGNOSIS — IMO0002 Reserved for concepts with insufficient information to code with codable children: Secondary | ICD-10-CM

## 2013-08-25 NOTE — Patient Instructions (Addendum)
You can go and get the Xrays at anytime at [facility]. Orders are already in Epic.  Your MRI is scheduled for Tues Jan. 6th at 10:45am at [facility]. Register in admitting 10 mins before 10:45am  FMLA form will be ready for you by Tuesday January in the afternoon.  WORKERS COMP #: [phone]

## 2013-08-25 NOTE — Progress Notes (Signed)
   Subjective:    Patient ID: [name], female    DOB: [birth year], 62 y.o.   MRN: [id number]  HPI Patient comes in today for followup on right knee pain and swelling. I see her in lieu of Dr.Fields' absence. Ultrasound guided cortisone injection into her right knee has not provided any symptom relief. She localizes most of her pain to the medial knee. She describes a feeling of locking and catching in the knee with persistent swelling. Denies any significant problems with his knee in the past. This all started with a work-related knee injury. She had to stop taking ibuprofen due to stomach upset. She has been icing her knee 3 times daily and using a double upright brace. Diagnostic ultrasound last week showed changes in the medial meniscus concerning for possible tear along with a rather sizable joint effusion. She also had findings consistent with a possible partial tear of the patellar tendon. Patient has been out of work due to her knee pain.    Review of Systems     Objective:   Physical Exam Well-developed, well-nourished. No acute distress, obese  Right knee: Range of motion is 0-100. Moderate size effusion. She is tender to palpation along the medial joint line. Pain but no popping with McMurray's maneuver. Mild tenderness along the lateral joint line as well. Knee is grossly stable to ligamentous exam. Neurovascularly intact distally. Walking with a significant limp.       Assessment & Plan:  Persistent right knee pain and swelling-rule out medial meniscal tear  Diagnostic ultrasound last week showed findings consistent with a partial tear of the patellar tendon but with her her persistent effusion coupled with history and physical exam I am concerned that she may have a rather significant meniscal tear. I would like to get some plain x-rays of her knee to evaluate degree of osteoarthritis present. We will also get an MRI scan of her knee to rule out a meniscal tear. She  will followup with Dr. Darrick Penna next week to go over those findings and delineate further treatment. In the meantime, she will remain out of work. FMLA paperwork completed today as well.

## 2013-09-01 ENCOUNTER — Ambulatory Visit (HOSPITAL_COMMUNITY)
Admission: RE | Admit: 2013-09-01 | Discharge: 2013-09-01 | Disposition: A | Discharge status: Home / Self Care | Payer: PRIVATE HEALTH INSURANCE | Source: Ambulatory Visit | Attending: Sports Medicine | Admitting: Sports Medicine

## 2013-09-01 DIAGNOSIS — S83241A Other tear of medial meniscus, current injury, right knee, initial encounter: Secondary | ICD-10-CM

## 2013-09-01 DIAGNOSIS — X58XXXA Exposure to other specified factors, initial encounter: Secondary | ICD-10-CM | POA: Insufficient documentation

## 2013-09-01 DIAGNOSIS — M25561 Pain in right knee: Secondary | ICD-10-CM

## 2013-09-01 DIAGNOSIS — M6789 Other specified disorders of synovium and tendon, multiple sites: Secondary | ICD-10-CM | POA: Insufficient documentation

## 2013-09-01 DIAGNOSIS — M25569 Pain in unspecified knee: Secondary | ICD-10-CM | POA: Insufficient documentation

## 2013-09-01 DIAGNOSIS — M1710 Unilateral primary osteoarthritis, unspecified knee: Secondary | ICD-10-CM | POA: Insufficient documentation

## 2013-09-01 DIAGNOSIS — M25469 Effusion, unspecified knee: Secondary | ICD-10-CM | POA: Insufficient documentation

## 2013-09-01 DIAGNOSIS — IMO0002 Reserved for concepts with insufficient information to code with codable children: Secondary | ICD-10-CM | POA: Insufficient documentation

## 2013-09-03 ENCOUNTER — Encounter: Payer: Self-pay | Admitting: Sports Medicine

## 2013-09-03 ENCOUNTER — Ambulatory Visit (INDEPENDENT_AMBULATORY_CARE_PROVIDER_SITE_OTHER): Payer: PRIVATE HEALTH INSURANCE | Admitting: Sports Medicine

## 2013-09-03 ENCOUNTER — Encounter: Payer: PRIVATE HEALTH INSURANCE | Admitting: Sports Medicine

## 2013-09-03 VITALS — Ht 65.0 in | Wt 221.0 lb

## 2013-09-03 DIAGNOSIS — M25569 Pain in unspecified knee: Secondary | ICD-10-CM | POA: Diagnosis not present

## 2013-09-03 DIAGNOSIS — S83241A Other tear of medial meniscus, current injury, right knee, initial encounter: Secondary | ICD-10-CM

## 2013-09-03 DIAGNOSIS — IMO0002 Reserved for concepts with insufficient information to code with codable children: Secondary | ICD-10-CM

## 2013-09-03 DIAGNOSIS — M25561 Pain in right knee: Secondary | ICD-10-CM

## 2013-09-03 NOTE — Progress Notes (Signed)
Patient ID: [name], female   DOB: 01-19-1951, 63 y.o.   MRN: [id number]  F/U of Knee pain post injury  DOI 08/04/13 when she bumped knee hard against patient bed corner. Immediate pain after that.  Started swelling within 48 hrs - ice and ibuprofen made on real diffence.  08/18/13 Saw me with severe pain and bad limp. Large effusion and probable meniscus tear that day. CSI took away some swelling but pain still severe. I took her out of work as knee seemed unstable.  Braced that day.  08/25/13 Saw Dr Micheline Chapman.  Still with a lot of pain and difficulty walking.  MRI obtained. Complex medial meniscus tear and some loss of articular cartilage.  Since her last visit she has had some relief of pain after taking Vicodin. And she has tried to walk without the brace to get to the bathroom  the knee gives out. Swelling is less but is very difficult to walk without a cane and a brace on  Physical examination Pleasant female, obese, walks with a cane and a stiff right knee  Ht 5\' 5"  (1.651 m)  Wt 221 lb (100.245 kg)  BMI 36.78 kg/m2  Right knee shows moderate warmth and a mild-to-moderate effusion She now can get to full extension She can get flexion of the 120 before she gets pain Rotation of the knee causes medial joint line burning Tenderness to palpation along the medial joint line Walking without the brace shows that she is unsteady with weight on the right leg

## 2013-09-03 NOTE — Assessment & Plan Note (Signed)
Pain is doing better with ice, bracing, and she is using some pain medications as needed

## 2013-09-03 NOTE — Patient Instructions (Signed)
DR [facility] [place] [address] MON JAN 12TH AT [place] [phone]

## 2013-09-03 NOTE — Assessment & Plan Note (Signed)
The MRI confirms the meniscus tear seen on ultrasound and it looks fairly significant  Mechanical symptoms persisting now for 4 weeks I think she needs to go ahead and see the surgeon  I would like to send her to Dr. supple who has seen other members of her family

## 2013-09-29 ENCOUNTER — Other Ambulatory Visit: Payer: Self-pay | Admitting: Obstetrics and Gynecology

## 2013-09-29 ENCOUNTER — Ambulatory Visit: Payer: PRIVATE HEALTH INSURANCE | Admitting: Physical Therapy

## 2013-09-30 ENCOUNTER — Ambulatory Visit: Payer: PRIVATE HEALTH INSURANCE | Admitting: Physical Therapy

## 2013-10-05 ENCOUNTER — Ambulatory Visit: Payer: Self-pay

## 2013-10-06 ENCOUNTER — Ambulatory Visit: Payer: PRIVATE HEALTH INSURANCE | Attending: Orthopedic Surgery | Admitting: Physical Therapy

## 2013-10-06 DIAGNOSIS — M25519 Pain in unspecified shoulder: Secondary | ICD-10-CM | POA: Insufficient documentation

## 2013-10-06 DIAGNOSIS — IMO0001 Reserved for inherently not codable concepts without codable children: Secondary | ICD-10-CM | POA: Insufficient documentation

## 2013-10-07 ENCOUNTER — Ambulatory Visit: Payer: PRIVATE HEALTH INSURANCE | Admitting: Physical Therapy

## 2013-10-09 ENCOUNTER — Ambulatory Visit: Payer: PRIVATE HEALTH INSURANCE | Admitting: Physical Therapy

## 2013-10-12 ENCOUNTER — Ambulatory Visit: Payer: PRIVATE HEALTH INSURANCE | Admitting: Physical Therapy

## 2013-10-14 ENCOUNTER — Ambulatory Visit: Payer: PRIVATE HEALTH INSURANCE | Admitting: Physical Therapy

## 2013-10-15 ENCOUNTER — Ambulatory Visit: Payer: PRIVATE HEALTH INSURANCE | Admitting: Physical Therapy

## 2013-10-19 ENCOUNTER — Ambulatory Visit: Payer: PRIVATE HEALTH INSURANCE | Admitting: Physical Therapy

## 2013-10-21 ENCOUNTER — Ambulatory Visit: Payer: PRIVATE HEALTH INSURANCE | Admitting: Physical Therapy

## 2013-10-22 ENCOUNTER — Ambulatory Visit: Payer: PRIVATE HEALTH INSURANCE | Admitting: Physical Therapy

## 2013-10-26 ENCOUNTER — Ambulatory Visit: Payer: PRIVATE HEALTH INSURANCE | Attending: Orthopedic Surgery | Admitting: Physical Therapy

## 2013-10-26 DIAGNOSIS — M25519 Pain in unspecified shoulder: Secondary | ICD-10-CM | POA: Insufficient documentation

## 2013-10-26 DIAGNOSIS — IMO0001 Reserved for inherently not codable concepts without codable children: Secondary | ICD-10-CM | POA: Insufficient documentation

## 2013-10-28 ENCOUNTER — Ambulatory Visit: Payer: PRIVATE HEALTH INSURANCE | Admitting: Physical Therapy

## 2013-10-29 ENCOUNTER — Ambulatory Visit: Payer: PRIVATE HEALTH INSURANCE | Admitting: Physical Therapy

## 2013-10-29 ENCOUNTER — Encounter (HOSPITAL_COMMUNITY): Payer: Self-pay | Admitting: Pharmacy Technician

## 2013-11-02 NOTE — Pre-Procedure Instructions (Signed)
[name]  11/02/2013   Your procedure is scheduled on:  Thursday, March 12.  Report to [facility], Main Entrance [name] "A" at 10:05 AM.  Call this number if you have problems the morning of surgery: [phone]   Remember:   Do not eat food or drink liquids after midnight Wednesday.    Take these medicines the morning of surgery with A SIP OF WATER: pantoprazole (PROTONIX).   Stop taking Herbal medications.   Do not wear jewelry, make-up or nail polish.  Do not wear lotions, powders, or perfumes.   Do not shave 48 hours prior to surgery.  Do not bring valuables to the hospital.  [facility] is not responsible for any belongings or valuables.               Contacts, dentures or bridgework may not be worn into surgery.  Leave suitcase in the car. After surgery it may be brought to your room.  For patients admitted to the hospital, discharge time is determined by your                treatment team.               Patients discharged the day of surgery will not be allowed to drive home.  Name and phone number of your driver: -   Special Instructions: Review  [place] - Preparing For Surgery.   Please read over the following fact sheets that you were given: Pain Booklet, Coughing and Deep Breathing and Surgical Site Infection Prevention

## 2013-11-03 ENCOUNTER — Ambulatory Visit: Payer: PRIVATE HEALTH INSURANCE | Attending: Orthopedic Surgery | Admitting: Physical Therapy

## 2013-11-03 ENCOUNTER — Encounter (HOSPITAL_COMMUNITY)
Admission: RE | Admit: 2013-11-03 | Discharge: 2013-11-03 | Disposition: A | Discharge status: Home / Self Care | Payer: PRIVATE HEALTH INSURANCE | Source: Ambulatory Visit | Attending: Orthopedic Surgery | Admitting: Orthopedic Surgery

## 2013-11-03 ENCOUNTER — Encounter (HOSPITAL_COMMUNITY)
Admission: RE | Admit: 2013-11-03 | Discharge: 2013-11-03 | Disposition: A | Discharge status: Home / Self Care | Payer: PRIVATE HEALTH INSURANCE | Source: Ambulatory Visit | Attending: Anesthesiology | Admitting: Anesthesiology

## 2013-11-03 ENCOUNTER — Encounter (HOSPITAL_COMMUNITY): Payer: Self-pay | Admitting: *Deleted

## 2013-11-03 ENCOUNTER — Encounter (HOSPITAL_COMMUNITY): Payer: Self-pay

## 2013-11-03 DIAGNOSIS — IMO0001 Reserved for inherently not codable concepts without codable children: Secondary | ICD-10-CM | POA: Insufficient documentation

## 2013-11-03 DIAGNOSIS — M25519 Pain in unspecified shoulder: Secondary | ICD-10-CM | POA: Insufficient documentation

## 2013-11-03 DIAGNOSIS — Z01812 Encounter for preprocedural laboratory examination: Secondary | ICD-10-CM | POA: Insufficient documentation

## 2013-11-03 DIAGNOSIS — Z01810 Encounter for preprocedural cardiovascular examination: Secondary | ICD-10-CM | POA: Insufficient documentation

## 2013-11-03 DIAGNOSIS — Z01818 Encounter for other preprocedural examination: Secondary | ICD-10-CM | POA: Insufficient documentation

## 2013-11-03 HISTORY — DX: Headache: R51

## 2013-11-03 HISTORY — DX: Unspecified osteoarthritis, unspecified site: M19.90

## 2013-11-03 HISTORY — DX: Malignant (primary) neoplasm, unspecified: C80.1

## 2013-11-03 HISTORY — DX: Gastro-esophageal reflux disease without esophagitis: K21.9

## 2013-11-03 HISTORY — DX: Sleep apnea, unspecified: G47.30

## 2013-11-03 HISTORY — DX: Personal history of urinary calculi: Z87.442

## 2013-11-03 LAB — CBC WITH DIFFERENTIAL/PLATELET
Basophils Absolute: 0 10*3/uL (ref 0.0–0.1)
Basophils Relative: 0 % (ref 0–1)
EOS ABS: 0.2 10*3/uL (ref 0.0–0.7)
EOS PCT: 2 % (ref 0–5)
HCT: 32.7 % — ABNORMAL LOW (ref 36.0–46.0)
HEMOGLOBIN: 11 g/dL — AB (ref 12.0–15.0)
Lymphocytes Relative: 30 % (ref 12–46)
Lymphs Abs: 2.2 10*3/uL (ref 0.7–4.0)
MCH: 28.8 pg (ref 26.0–34.0)
MCHC: 33.6 g/dL (ref 30.0–36.0)
MCV: 85.6 fL (ref 78.0–100.0)
MONOS PCT: 7 % (ref 3–12)
Monocytes Absolute: 0.5 10*3/uL (ref 0.1–1.0)
Neutro Abs: 4.3 10*3/uL (ref 1.7–7.7)
Neutrophils Relative %: 60 % (ref 43–77)
Platelets: 231 10*3/uL (ref 150–400)
RBC: 3.82 MIL/uL — ABNORMAL LOW (ref 3.87–5.11)
RDW: 13.7 % (ref 11.5–15.5)
WBC: 7.2 10*3/uL (ref 4.0–10.5)

## 2013-11-03 LAB — COMPREHENSIVE METABOLIC PANEL
ALT: 13 U/L (ref 0–35)
AST: 15 U/L (ref 0–37)
Albumin: 4 g/dL (ref 3.5–5.2)
Alkaline Phosphatase: 83 U/L (ref 39–117)
BUN: 20 mg/dL (ref 6–23)
CALCIUM: 9.8 mg/dL (ref 8.4–10.5)
CO2: 26 mEq/L (ref 19–32)
CREATININE: 1.05 mg/dL (ref 0.50–1.10)
Chloride: 100 mEq/L (ref 96–112)
GFR calc non Af Amer: 56 mL/min — ABNORMAL LOW (ref >90)
GFR, EST AFRICAN AMERICAN: 65 mL/min — AB (ref >90)
GLUCOSE: 113 mg/dL — AB (ref 70–99)
Potassium: 3.8 mEq/L (ref 3.7–5.3)
Sodium: 140 mEq/L (ref 137–147)
Total Bilirubin: <0.2 mg/dL — ABNORMAL LOW (ref 0.3–1.2)
Total Protein: 7.6 g/dL (ref 6.0–8.3)

## 2013-11-03 LAB — PROTIME-INR
INR: 0.88 (ref 0.00–1.49)
PROTHROMBIN TIME: 11.8 s (ref 11.6–15.2)

## 2013-11-03 LAB — APTT: aPTT: 30 seconds (ref 24–37)

## 2013-11-03 NOTE — Progress Notes (Signed)
Ms [name] cardiologist is Dr Mare Ferrari.  Patient denies any chest pain, palpations,SOB.

## 2013-11-03 NOTE — Pre-Procedure Instructions (Signed)
[name]  11/03/2013   Your procedure is scheduled on:  Thursday, March 12.  Report to [facility], Main Entrance [name] "A" at 10:05 AM.  Call this number if you have problems the morning of surgery: [phone]   Remember:   Do not eat food or drink liquids after midnight Wednesday.    Take these medicines the morning of surgery with A SIP OF WATER: pantoprazole (PROTONIX).   Stop taking Herbal medications.   Do not wear jewelry, make-up or nail polish.  Do not wear lotions, powders, or perfumes.   Do not shave 48 hours prior to surgery.  Do not bring valuables to the hospital.  [facility] is not responsible for any belongings or valuables.               Contacts, dentures or bridgework may not be worn into surgery.  Leave suitcase in the car. After surgery it may be brought to your room.  For patients admitted to the hospital, discharge time is determined by your treatment team.                Special Instructions: Review  [place] - Preparing For Surgery.   Please read over the following fact sheets that you were given: Pain Booklet, Coughing and Deep Breathing and Surgical Site Infection Prevention

## 2013-11-04 MED ORDER — CEFAZOLIN SODIUM-DEXTROSE 2-3 GM-% IV SOLR: 2.0000 g | INTRAVENOUS | Status: DC

## 2013-11-04 NOTE — Progress Notes (Addendum)
Anesthesia Chart Review:  Patient is a 63 year old female scheduled for right knee arthroscopy with debridement on 11/05/13 by Dr. Onnie Graham.  History includes non-smoker, moderate AS by 01/2012 echo, MVP, DM2, hypercholesterolemia, breast cancer s/p bilateral mastectomy '08, GERD, OSA, migraines. PCP is listed as Dr. Josetta Huddle. Endocrinologist is Dr. Bubba Camp.  Cardiologist is Dr. Mare Ferrari, last visit 10/20/12.  She denied any chest pain, palpitations, SOB at her PAT visit. 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  Echo on 02/11/12 showed: - Left ventricle: The cavity size was normal. Wall thickness was increased in a pattern of mild LVH. Systolic function was normal. The estimated ejection fraction was in the range of 55% to 65%. Wall motion was normal; there were no regional wall motion abnormalities. Doppler parameters are consistent with abnormal left ventricular relaxation (grade 1 diastolic dysfunction). - Aortic valve: There was moderate stenosis. Mild regurgitation. VTI ratio of LVOT to aortic valve: 0.3. Indexed valve area: 0.49cm^2/m^2 (VTI). Peak velocity ratio of LVOT to aortic valve: 0.32. Indexed valve area: 0.53cm^2/m^2 (Vmax). Mean gradient: 80mm Hg (S). Peak gradient: 22mm Hg (S). - Left atrium: The atrium was mildly dilated. - Pulmonary arteries: Systolic pressure was mildly increased. PA peak pressure: 9mm Hg (S).  EKG on 11/03/13 showed NSR, non-specific T wave abnormality.  Preoperative CXR and labs noted.  I reviewed above with anesthesiologist Dr. Linna Caprice.  Her echo is within two years. If patient is asymptomatic from her AS then it is anticipated that she can proceed as planned.  I left a voice message asking patient to call me before 5PM today, otherwise she will be further evaluated by her assigned anesthesiologists tomorrow.  George Hugh [facility] Short Stay Center/Anesthesiology Phone [phone] 11/04/2013 3:15 PM  Addendum:  11/04/2013 5:00 PM Patient returned my phone call.  She denies chest tightness, syncope/presyncope, SOB.  She works at [employer], but has been out of work since 07/2013 following her injury.  She was not having any CV symptoms when she was working either.  She plans to follow-up with Dr. Mare Ferrari again this year.

## 2013-11-05 ENCOUNTER — Encounter (HOSPITAL_COMMUNITY): Payer: Self-pay | Admitting: Anesthesiology

## 2013-11-05 ENCOUNTER — Ambulatory Visit (HOSPITAL_COMMUNITY)
Admission: RE | Admit: 2013-11-05 | Discharge: 2013-11-05 | Disposition: A | Discharge status: Home / Self Care | Payer: PRIVATE HEALTH INSURANCE | Source: Ambulatory Visit | Attending: Orthopedic Surgery | Admitting: Orthopedic Surgery

## 2013-11-05 ENCOUNTER — Ambulatory Visit (HOSPITAL_COMMUNITY): Payer: PRIVATE HEALTH INSURANCE | Admitting: Anesthesiology

## 2013-11-05 ENCOUNTER — Encounter (HOSPITAL_COMMUNITY): Payer: PRIVATE HEALTH INSURANCE | Admitting: Vascular Surgery

## 2013-11-05 ENCOUNTER — Encounter (HOSPITAL_COMMUNITY)
Admission: RE | Disposition: A | Discharge status: Home / Self Care | Payer: Self-pay | Source: Ambulatory Visit | Attending: Orthopedic Surgery

## 2013-11-05 DIAGNOSIS — Z9010 Acquired absence of unspecified breast and nipple: Secondary | ICD-10-CM | POA: Insufficient documentation

## 2013-11-05 DIAGNOSIS — M1710 Unilateral primary osteoarthritis, unspecified knee: Secondary | ICD-10-CM | POA: Insufficient documentation

## 2013-11-05 DIAGNOSIS — G4730 Sleep apnea, unspecified: Secondary | ICD-10-CM | POA: Insufficient documentation

## 2013-11-05 DIAGNOSIS — IMO0002 Reserved for concepts with insufficient information to code with codable children: Secondary | ICD-10-CM | POA: Insufficient documentation

## 2013-11-05 DIAGNOSIS — Z7982 Long term (current) use of aspirin: Secondary | ICD-10-CM | POA: Insufficient documentation

## 2013-11-05 DIAGNOSIS — R011 Cardiac murmur, unspecified: Secondary | ICD-10-CM | POA: Insufficient documentation

## 2013-11-05 DIAGNOSIS — I080 Rheumatic disorders of both mitral and aortic valves: Secondary | ICD-10-CM | POA: Insufficient documentation

## 2013-11-05 DIAGNOSIS — E7800 Pure hypercholesterolemia, unspecified: Secondary | ICD-10-CM | POA: Insufficient documentation

## 2013-11-05 DIAGNOSIS — I119 Hypertensive heart disease without heart failure: Secondary | ICD-10-CM | POA: Insufficient documentation

## 2013-11-05 DIAGNOSIS — Z853 Personal history of malignant neoplasm of breast: Secondary | ICD-10-CM | POA: Insufficient documentation

## 2013-11-05 DIAGNOSIS — K219 Gastro-esophageal reflux disease without esophagitis: Secondary | ICD-10-CM | POA: Insufficient documentation

## 2013-11-05 DIAGNOSIS — E119 Type 2 diabetes mellitus without complications: Secondary | ICD-10-CM | POA: Insufficient documentation

## 2013-11-05 DIAGNOSIS — S83289A Other tear of lateral meniscus, current injury, unspecified knee, initial encounter: Secondary | ICD-10-CM | POA: Insufficient documentation

## 2013-11-05 DIAGNOSIS — M2240 Chondromalacia patellae, unspecified knee: Secondary | ICD-10-CM | POA: Insufficient documentation

## 2013-11-05 DIAGNOSIS — X58XXXA Exposure to other specified factors, initial encounter: Secondary | ICD-10-CM | POA: Insufficient documentation

## 2013-11-05 HISTORY — DX: Cardiac murmur, unspecified: R01.1

## 2013-11-05 HISTORY — PX: KNEE ARTHROSCOPY: SHX127

## 2013-11-05 LAB — GLUCOSE, CAPILLARY: Glucose-Capillary: 98 mg/dL (ref 70–99)

## 2013-11-05 SURGERY — ARTHROSCOPY, KNEE
Anesthesia: General | Site: Knee | Laterality: Right

## 2013-11-05 MED ORDER — SUCCINYLCHOLINE CHLORIDE 20 MG/ML IJ SOLN
INTRAMUSCULAR | Status: DC | PRN
  Administered 2013-11-05: 110 mg via INTRAVENOUS

## 2013-11-05 MED ORDER — LACTATED RINGERS IV SOLN
INTRAVENOUS | Status: DC | PRN
  Administered 2013-11-05: 12:00:00 via INTRAVENOUS

## 2013-11-05 MED ORDER — GLYCOPYRROLATE 0.2 MG/ML IJ SOLN
INTRAMUSCULAR | Status: AC
  Filled 2013-11-05: qty 3

## 2013-11-05 MED ORDER — HYDROMORPHONE HCL PF 1 MG/ML IJ SOLN
0.2500 mg | INTRAMUSCULAR | Status: DC | PRN
  Administered 2013-11-05: 0.5 mg via INTRAVENOUS

## 2013-11-05 MED ORDER — PROPOFOL 10 MG/ML IV BOLUS
INTRAVENOUS | Status: DC | PRN
  Administered 2013-11-05: 50 mg via INTRAVENOUS
  Administered 2013-11-05: 150 mg via INTRAVENOUS
  Administered 2013-11-05: 40 mg via INTRAVENOUS

## 2013-11-05 MED ORDER — SODIUM CHLORIDE 0.9 % IR SOLN
Status: DC | PRN
  Administered 2013-11-05: 9000 mL

## 2013-11-05 MED ORDER — ROCURONIUM BROMIDE 50 MG/5ML IV SOLN
INTRAVENOUS | Status: AC
  Filled 2013-11-05: qty 1

## 2013-11-05 MED ORDER — LIDOCAINE HCL (CARDIAC) 20 MG/ML IV SOLN
INTRAVENOUS | Status: DC | PRN
  Administered 2013-11-05: 60 mg via INTRAVENOUS

## 2013-11-05 MED ORDER — ONDANSETRON HCL 4 MG/2ML IJ SOLN
INTRAMUSCULAR | Status: AC
  Filled 2013-11-05: qty 2

## 2013-11-05 MED ORDER — OXYCODONE HCL 5 MG/5ML PO SOLN: 5.0000 mg | Freq: Once | ORAL | Status: DC | PRN

## 2013-11-05 MED ORDER — MIDAZOLAM HCL 5 MG/5ML IJ SOLN
INTRAMUSCULAR | Status: DC | PRN
  Administered 2013-11-05: 2 mg via INTRAVENOUS

## 2013-11-05 MED ORDER — CHLORHEXIDINE GLUCONATE 4 % EX LIQD
60.0000 mL | Freq: Once | CUTANEOUS | Status: DC
  Filled 2013-11-05: qty 60

## 2013-11-05 MED ORDER — BUPIVACAINE HCL (PF) 0.25 % IJ SOLN
INTRAMUSCULAR | Status: AC
  Filled 2013-11-05: qty 30

## 2013-11-05 MED ORDER — LIDOCAINE HCL (CARDIAC) 20 MG/ML IV SOLN
INTRAVENOUS | Status: AC
  Filled 2013-11-05: qty 5

## 2013-11-05 MED ORDER — FENTANYL CITRATE 0.05 MG/ML IJ SOLN
INTRAMUSCULAR | Status: AC
  Filled 2013-11-05: qty 5

## 2013-11-05 MED ORDER — CEFAZOLIN SODIUM-DEXTROSE 2-3 GM-% IV SOLR
INTRAVENOUS | Status: AC
  Administered 2013-11-05: 2 g via INTRAVENOUS
  Filled 2013-11-05: qty 50

## 2013-11-05 MED ORDER — ONDANSETRON HCL 4 MG/2ML IJ SOLN: 4.0000 mg | Freq: Once | INTRAMUSCULAR | Status: DC | PRN

## 2013-11-05 MED ORDER — HYDROMORPHONE HCL PF 1 MG/ML IJ SOLN
INTRAMUSCULAR | Status: AC
  Filled 2013-11-05: qty 1

## 2013-11-05 MED ORDER — NEOSTIGMINE METHYLSULFATE 1 MG/ML IJ SOLN
INTRAMUSCULAR | Status: AC
  Filled 2013-11-05: qty 10

## 2013-11-05 MED ORDER — OXYCODONE-ACETAMINOPHEN 5-325 MG PO TABS: 1.0000 | ORAL_TABLET | ORAL | Status: DC | PRN

## 2013-11-05 MED ORDER — MIDAZOLAM HCL 2 MG/2ML IJ SOLN
INTRAMUSCULAR | Status: AC
  Filled 2013-11-05: qty 2

## 2013-11-05 MED ORDER — LACTATED RINGERS IV SOLN
INTRAVENOUS | Status: DC
  Administered 2013-11-05: 11:00:00 via INTRAVENOUS

## 2013-11-05 MED ORDER — FENTANYL CITRATE 0.05 MG/ML IJ SOLN
INTRAMUSCULAR | Status: DC | PRN
  Administered 2013-11-05: 100 ug via INTRAVENOUS

## 2013-11-05 MED ORDER — ONDANSETRON HCL 4 MG/2ML IJ SOLN
INTRAMUSCULAR | Status: DC | PRN
  Administered 2013-11-05: 4 mg via INTRAVENOUS

## 2013-11-05 MED ORDER — BUPIVACAINE-EPINEPHRINE PF 0.25-1:200000 % IJ SOLN
INTRAMUSCULAR | Status: DC | PRN
  Administered 2013-11-05: 30 mL

## 2013-11-05 MED ORDER — PROPOFOL 10 MG/ML IV BOLUS
INTRAVENOUS | Status: AC
  Filled 2013-11-05: qty 20

## 2013-11-05 MED ORDER — OXYCODONE HCL 5 MG PO TABS
5.0000 mg | ORAL_TABLET | Freq: Once | ORAL | Status: DC | PRN
Start: 2013-11-05 — End: 2013-11-05

## 2013-11-05 SURGICAL SUPPLY — 39 items
BANDAGE ELASTIC 6 VELCRO ST LF (GAUZE/BANDAGES/DRESSINGS) ×3 IMPLANT
BLADE CUDA 5.5 (BLADE) IMPLANT
BLADE CUTTER GATOR 3.5 (BLADE) ×3 IMPLANT
BLADE GREAT WHITE 4.2 (BLADE) ×2 IMPLANT
BLADE GREAT WHITE 4.2MM (BLADE) ×1
BOOTCOVER CLEANROOM LRG (PROTECTIVE WEAR) ×12 IMPLANT
CLOSURE STERI-STRIP 1/2X4 (GAUZE/BANDAGES/DRESSINGS) ×1
CLOSURE WOUND 1/2 X4 (GAUZE/BANDAGES/DRESSINGS) ×1
CLSR STERI-STRIP ANTIMIC 1/2X4 (GAUZE/BANDAGES/DRESSINGS) ×2 IMPLANT
DRAPE ARTHROSCOPY W/POUCH 114 (DRAPES) ×3 IMPLANT
DRSG PAD ABDOMINAL 8X10 ST (GAUZE/BANDAGES/DRESSINGS) ×3 IMPLANT
DURAPREP 26ML APPLICATOR (WOUND CARE) ×3 IMPLANT
GLOVE BIO SURGEON STRL SZ7.5 (GLOVE) ×3 IMPLANT
GLOVE BIO SURGEON STRL SZ8 (GLOVE) ×3 IMPLANT
GLOVE EUDERMIC 7 POWDERFREE (GLOVE) ×3 IMPLANT
GLOVE SS BIOGEL STRL SZ 7.5 (GLOVE) ×1 IMPLANT
GLOVE SUPERSENSE BIOGEL SZ 7.5 (GLOVE) ×2
GOWN STRL REUS W/ TWL LRG LVL3 (GOWN DISPOSABLE) ×1 IMPLANT
GOWN STRL REUS W/ TWL XL LVL3 (GOWN DISPOSABLE) ×2 IMPLANT
GOWN STRL REUS W/TWL LRG LVL3 (GOWN DISPOSABLE) ×2
GOWN STRL REUS W/TWL XL LVL3 (GOWN DISPOSABLE) ×4
KIT BASIN OR (CUSTOM PROCEDURE TRAY) ×3 IMPLANT
KIT ROOM TURNOVER OR (KITS) ×3 IMPLANT
MANIFOLD NEPTUNE II (INSTRUMENTS) ×3 IMPLANT
NEEDLE 18GX1X1/2 (RX/OR ONLY) (NEEDLE) ×3 IMPLANT
PACK ARTHROSCOPY DSU (CUSTOM PROCEDURE TRAY) ×3 IMPLANT
PAD ABD 8X10 STRL (GAUZE/BANDAGES/DRESSINGS) ×3 IMPLANT
PAD ARMBOARD 7.5X6 YLW CONV (MISCELLANEOUS) ×6 IMPLANT
PADDING CAST COTTON 6X4 STRL (CAST SUPPLIES) ×3 IMPLANT
RING FOAM WHITE (MISCELLANEOUS) ×3 IMPLANT
SET ARTHROSCOPY TUBING (MISCELLANEOUS) ×2
SET ARTHROSCOPY TUBING LN (MISCELLANEOUS) ×1 IMPLANT
SPONGE GAUZE 4X4 12PLY (GAUZE/BANDAGES/DRESSINGS) ×3 IMPLANT
SPONGE LAP 4X18 X RAY DECT (DISPOSABLE) ×3 IMPLANT
STRIP CLOSURE SKIN 1/2X4 (GAUZE/BANDAGES/DRESSINGS) ×2 IMPLANT
SYR 30ML LL (SYRINGE) ×3 IMPLANT
TOWEL OR 17X24 6PK STRL BLUE (TOWEL DISPOSABLE) ×3 IMPLANT
WAND 90 DEG TURBOVAC W/CORD (SURGICAL WAND) ×3 IMPLANT
WATER STERILE IRR 1000ML POUR (IV SOLUTION) ×3 IMPLANT

## 2013-11-05 NOTE — Anesthesia Postprocedure Evaluation (Signed)
  Anesthesia Post-op Note  Patient: [name]  Procedure(s) Performed: Procedure(s): ARTHROSCOPY RIGHT KNEE WITH DEBRIDEMENT (Right)  Patient Location: PACU  Anesthesia Type:General  Level of Consciousness: awake, alert  and oriented  Airway and Oxygen Therapy: Patient Spontanous Breathing and Patient connected to nasal cannula oxygen  Post-op Pain: mild  Post-op Assessment: Post-op Vital signs reviewed, Patient's Cardiovascular Status Stable, Respiratory Function Stable, Patent Airway and Pain level controlled  Post-op Vital Signs: stable  Complications: No apparent anesthesia complications

## 2013-11-05 NOTE — H&P (Signed)
[name]    Chief Complaint: right medial meniscal tear HPI: The patient is a 63 y.o. female with right knee pain and mechanical symptoms refractory to conservative management  Past Medical History  Diagnosis Date  . Aortic stenosis   . Hypertension   . Diabetes mellitus   . Hypercholesterolemia   . MVP (mitral valve prolapse)   . Hypertensive cardiovascular disease   . HX: breast cancer   . History of migraines   . Heart murmur   . Cancer     breast  . Sleep apnea   . Headache(784.0)     hx migraines , none in past year  . History of kidney stones     passed  2  . GERD (gastroesophageal reflux disease)   . Arthritis     knee    Past Surgical History  Procedure Laterality Date  . Hip fracture surgery Left 1968  . Tubal ligation  1994  . Mastectomy Bilateral 2008    bilateral Dr.Streck  . Mastectomy Bilateral     Family History  Problem Relation Age of Onset  . Hypertension Mother   . Hypertension Father     Social History:  reports that she has never smoked. She does not have any smokeless tobacco history on file. She reports that she does not drink alcohol or use illicit drugs.  Allergies:  Allergies  Allergen Reactions  . Crestor [Rosuvastatin Calcium]     Myalgias (high doses)  . Iodine Other (See Comments)    unknown  . Other     Shell fish  . Penicillins Hives and Itching  . Pravachol     Muscle pain    Medications Prior to Admission  Medication Sig Dispense Refill  . aspirin EC 81 MG tablet Take 81 mg by mouth 2 (two) times a week.       . Cholecalciferol (VITAMIN D) 2000 UNITS CAPS Take 2,000 Units by mouth daily.       . Cyanocobalamin 1000 MCG SUBL Place 1 tablet under the tongue daily.       . metFORMIN (GLUMETZA) 500 MG (MOD) 24 hr tablet Take 500 mg by mouth at bedtime.      . Multiple Vitamin (MULTIVITAMIN) tablet Take 1 tablet by mouth daily.      Marland Kitchen olmesartan-hydrochlorothiazide (BENICAR HCT) 40-12.5 MG per tablet Take 1.5  tablets by mouth daily. 1 and 1/2 tablets daily      . Omega-3 Fatty Acids (FISH OIL PO) Take 3 capsules by mouth daily.       . pantoprazole (PROTONIX) 40 MG tablet Take 40 mg by mouth every other day. Every other day      . rosuvastatin (CRESTOR) 5 MG tablet Take 5 mg by mouth daily.      . vitamin C (ASCORBIC ACID) 500 MG tablet Take 500 mg by mouth 2 (two) times daily.         Physical Exam: right knee with painful and restricted motion as noted at recent office visits  Vitals  Temp:  [98.9 F (37.2 C)] 98.9 F (37.2 C) (03/12 1010) Pulse Rate:  [62] 62 (03/12 1010) Resp:  [18] 18 (03/12 1010) BP: (157)/(89) 157/89 mmHg (03/12 1010) SpO2:  [99 %] 99 % (03/12 1010)  Assessment/Plan  Impression: right medial meniscal tear  Plan of Action: Procedure(s): ARTHROSCOPY RIGHT KNEE WITH DEBRIDEMENT  Kael Keetch M 11/05/2013, 10:57 AM

## 2013-11-05 NOTE — Op Note (Signed)
11/05/2013  12:31 PM  PATIENT:   [name]  63 y.o. 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  ASSISTANTS: Shuford pac   ANESTHESIA:   GET + local  EBL: min  SPECIMEN:  none  Drains: none   PATIENT DISPOSITION:  PACU - hemodynamically stable.    PLAN OF CARE: Discharge to home after PACU  Dictation# [id number]

## 2013-11-05 NOTE — Anesthesia Preprocedure Evaluation (Addendum)
Anesthesia Evaluation  Patient identified by MRN, date of birth, ID band Patient awake    Reviewed: Allergy & Precautions, H&P , NPO status   Airway Mallampati: I TM Distance: >3 FB Neck ROM: Full    Dental  (+) Teeth Intact, Dental Advisory Given   Pulmonary sleep apnea ,  breath sounds clear to auscultation        Cardiovascular hypertension, Pt. on medications + Valvular Problems/Murmurs AS Rhythm:Regular Rate:Normal + Systolic murmurs    Neuro/Psych    GI/Hepatic GERD-  Controlled,  Endo/Other  diabetes, Well Controlled, Type 2  Renal/GU      Musculoskeletal   Abdominal   Peds  Hematology   Anesthesia Other Findings   Reproductive/Obstetrics                         Anesthesia Physical Anesthesia Plan  ASA: III  Anesthesia Plan: General   Post-op Pain Management:    Induction: Intravenous  Airway Management Planned: LMA and Oral ETT  Additional Equipment:   Intra-op Plan:   Post-operative Plan: Extubation in OR  Informed Consent: I have reviewed the patients History and Physical, chart, labs and discussed the procedure including the risks, benefits and alternatives for the proposed anesthesia with the patient or authorized representative who has indicated his/her understanding and acceptance.   Dental advisory given  Plan Discussed with: CRNA, Anesthesiologist and Surgeon  Anesthesia Plan Comments: (MMT R. Knee Aortic Stenosis moderate Mean gradient 28 by echo 02/11/12 normal EF Type 2 DM glucose 98 GERD  Plan GA with LMA   )      Anesthesia Quick Evaluation

## 2013-11-05 NOTE — Progress Notes (Signed)
Received report from Margret Chance.

## 2013-11-05 NOTE — Preoperative (Signed)
Beta Blockers   Reason not to administer Beta Blockers:Not Applicable 

## 2013-11-05 NOTE — Progress Notes (Signed)
Orthopedic Tech Progress Note Patient Details:  [name] June 22, 1951 [id number]  Ortho Devices Type of Ortho Device: Crutches Ortho Device/Splint Interventions: Application   Cammer, Theodoro Parma 11/05/2013, 1:45 PM

## 2013-11-05 NOTE — Discharge Instructions (Signed)
Metta Clines. Supple, M.D., F.A.A.O.S. Orthopaedic Surgery Specializing in Arthroscopic and Reconstructive Surgery of the Shoulder and Knee [phone] [address] [place], [place] [address] - Fax [phone]   POST-OP KNEE ARTHROSCOPY INSTRUCTIONS  Pain You will be expected to have a moderate amount of pain in the affected knee for approximately two weeks. However, the first two days will be the most severe pain. A prescription has been provided to take as needed for the pain. The pain can be reduced by applying ice packs to the knee for the first 1-2 weeks post surgery. Also, keeping the leg elevated on pillows will help alleviate the pain. If you develop any acute pain or swelling in your calf muscle, please call the doctor.  Activity It is preferred that you stay at bed rest for approximately 24 hours. However, you may go to the bathroom with help. Weight bearing as tolerated. You may begin the knee exercises the day of surgery. Discontinue crutches as the knee pain resolves.  Dressing Keep the dressing dry. If the ace bandage should wrinkle or roll up, this can be rewrapped to prevent ridges in the bandage. You may remove all dressings in 48 hours, leave steri-strips in place and apply bandaids to each wound. You may shower on the 3rd day after surgery but no tub bath.   Symptoms to report to your doctor Extreme pain Extreme swelling Temperature above 101 degrees Change in the feeling, color, or movement of your toes Redness, heat, or swelling at your incision  Exercise If is preferred that as soon as possible you try to do a straight leg raise without bending the knee and concentrate on bringing the heel of your foot off the bed up to approximately 45 degrees and hold for the count of 10 seconds. Repeat this at least 10 times three or four times per day. Additional exercises are provided below.  You are encouraged to bend the knee as tolerated.  Follow-Up Call to  schedule a follow-up appointment in 7-10 days.  POST-OP EXERCISES  Short Arc Quads  1. Lie on back with legs straight. Place towel roll under thigh, just above knee. 2. Tighten thigh muscles to straighten knee and lift heel off bed. 3. Hold for slow count of five, then lower. 4. Do three sets of ten    Straight Leg Raises  1. Lie on back with operative leg straight and other leg bent. 2. Keeping operative leg completely straight, slowly lift operative leg so foot is 5 inches off bed. 3. Hold for slow count of five, then lower. 4. Do three sets of ten.    DO BOTH EXERCISES 2 TIMES A DAY  Ankle Pumps  Work/move the operative ankle and foot up and down 10 times every hour while awake.  What to eat:  For your first meals, you should eat lightly; only small meals initially.  If you do not have nausea, you may eat larger meals.  Avoid spicy, greasy and heavy food.    General Anesthesia, Adult, Care After  Refer to this sheet in the next few weeks. These instructions provide you with information on caring for yourself after your procedure. Your health care provider may also give you more specific instructions. Your treatment has been planned according to current medical practices, but problems sometimes occur. Call your health care provider if you have any problems or questions after your procedure.  WHAT TO EXPECT AFTER THE PROCEDURE  After the procedure, it is typical  to experience:  Sleepiness.  Nausea and vomiting. HOME CARE INSTRUCTIONS  For the first 24 hours after general anesthesia:  Have a responsible person with you.  Do not drive a car. If you are alone, do not take public transportation.  Do not drink alcohol.  Do not take medicine that has not been prescribed by your health care provider.  Do not sign important papers or make important decisions.  You may resume a normal diet and activities as directed by your health care provider.  Change bandages (dressings) as  directed.  If you have questions or problems that seem related to general anesthesia, call the hospital and ask for the anesthetist or anesthesiologist on call. SEEK MEDICAL CARE IF:  You have nausea and vomiting that continue the day after anesthesia.  You develop a rash. SEEK IMMEDIATE MEDICAL CARE IF:  You have difficulty breathing.  You have chest pain.  You have any allergic problems. Document Released: 11/19/2000 Document Revised: 04/15/2013 Document Reviewed: 02/26/2013  [facility] Patient Information 2014 [place], [place].

## 2013-11-05 NOTE — Transfer of Care (Signed)
Immediate Anesthesia Transfer of Care Note  Patient: [name]  Procedure(s) Performed: Procedure(s): ARTHROSCOPY RIGHT KNEE WITH DEBRIDEMENT (Right)  Patient Location: PACU  Anesthesia Type:General  Level of Consciousness: awake and alert   Airway & Oxygen Therapy: Patient Spontanous Breathing and Patient connected to nasal cannula oxygen  Post-op Assessment: Report given to PACU RN and Post -op Vital signs reviewed and stable  Post vital signs: Reviewed and stable  Complications: No apparent anesthesia complications

## 2013-11-06 NOTE — Op Note (Signed)
NAMEMarland Kitchen  [name], [name]:  [id number]  MEDICAL RECORD NO.:  [id number]  LOCATION:  [facility]                         FACILITY:  [facility]  PHYSICIAN:  Metta Clines. Eleno Weimar, M.D.  DATE OF BIRTH:  11/19/1950  DATE OF PROCEDURE:  11/05/2013 DATE OF DISCHARGE:                              OPERATIVE REPORT   PREOPERATIVE DIAGNOSIS:  Right knee medial meniscus tear.  POSTOPERATIVE DIAGNOSES: 1. Right knee medial meniscus tear. 2. Right knee lateral meniscus tear. 3. Right knee patellar chondromalacia. 4. Chondromalacia of the medial femoral condyle.  PROCEDURE: 1. Right knee diagnostic arthroscopy. 2. Partial medial and partial lateral meniscectomies. 3. Chondroplasty of the patella . 4. Chondroplasty of medical femoral condyle,  SURGEON:  Metta Clines. Giuliano Preece, M.D.  Terrence DupontOlivia Mackie A. Shuford, P.A.-C.  ANESTHESIA:  General endotracheal as well as local.  TOURNIQUET TIME:  __________.  ESTIMATED BLOOD LOSS:  Minimal.  DRAINS:  None.  HISTORY:  Ms. Chern is a 63 year old female who has had persistent right knee pain predominantly medially with plain radiographs showing some mild degenerative changes and MRI scan showing some degenerative chondrosis and a medial meniscal tear.  Due to her ongoing pain, swelling, mechanical symptoms, and failure to respond to conservative management with increasing function limitations, she is brought to the operating room at this time for planned right knee arthroscopy as described below.  Preoperatively, I counseled Ms. Minerva Fester on treatment options as well as risks versus benefits thereof.  Possible surgical complications were all reviewed including potential for bleeding, infection, neurovascular injury, DVT, PE as well as persistent pain.  She understands and accepts and agrees with our planned procedure.  PROCEDURE IN DETAIL:  After undergoing routine preop evaluation, the patient received prophylactic antibiotics.   Brought to the operating room and placed supine on the operating table, underwent initial __________ LMA general but this was suboptimal so was proceeded with a general endotracheal anesthesia which was induced smoothly and without difficulty.  The right lower extremity was then sterilely prepped, placed in leg holder, and sterilely prepped and draped in standard fashion.  Time-out was called.  Standard arthroscopy portals were established and diagnostic arthroscopy was performed.  The suprapatellar pouch and gutter showed some mild diffuse synovitis but no obvious loose bodies.  There was advanced chondromalacia over the majority of the patella __________ lateral 2/3 with multiple frayed cartilage fragments and a chondroplasty was performed to a smooth and stable chondral base. I did not appreciate any obvious full-thickness defect, but this was significantly degenerated diffusely.  Mild fissuring of the trochlear groove.  The intercondylar notch of the ACL and PCL noted to be intact. Medially, there was an area of grade 2 chondromalacia on the medial femoral condyle.  This is adjacent to an area there was a radial tear at the junction between the anterior and posterior halves of the medial meniscus.  The medial meniscal tear was trimmed back to a stable and smooth contour with a basket and shaver was used for final contouring and removal of the meniscal fragments.  I then used the Arthrex wand to obtain hemostasis and perform additional contouring.  Chondroplasty was performed of the area the medial femoral condyle as well with a shaver, but no full-thickness  defects noted.  Laterally, there is a small degenerative tear involving the middle posterior thirds of the meniscus which was trimmed back to a stable margin with shaver.  At this point, final inspection and irrigation was then completed.  Instruments removed.  Combination of Marcaine plain was instilled in the knee joint. Portals  were closed with Steri-Strips.  __________dry dressing taped at the right knee, leg was wrapped with Ace bandage and support stocking. The patient was awakened, extubated, and taken to recovery room in stable condition.     Metta Clines. [place] Carreno, M.D.     KMS/MEDQ  D:  11/05/2013  T:  11/06/2013  Job:  [id number]

## 2013-11-10 ENCOUNTER — Encounter (HOSPITAL_COMMUNITY): Payer: Self-pay | Admitting: Orthopedic Surgery

## 2013-11-18 ENCOUNTER — Other Ambulatory Visit: Payer: Self-pay | Admitting: Cardiology

## 2013-11-24 ENCOUNTER — Ambulatory Visit: Payer: PRIVATE HEALTH INSURANCE | Admitting: Physical Therapy

## 2013-11-25 ENCOUNTER — Ambulatory Visit: Payer: PRIVATE HEALTH INSURANCE | Attending: Orthopedic Surgery | Admitting: Physical Therapy

## 2013-11-25 ENCOUNTER — Ambulatory Visit: Payer: PRIVATE HEALTH INSURANCE | Admitting: Physical Therapy

## 2013-11-25 DIAGNOSIS — R609 Edema, unspecified: Secondary | ICD-10-CM | POA: Insufficient documentation

## 2013-11-25 DIAGNOSIS — M25569 Pain in unspecified knee: Secondary | ICD-10-CM | POA: Insufficient documentation

## 2013-11-25 DIAGNOSIS — IMO0001 Reserved for inherently not codable concepts without codable children: Secondary | ICD-10-CM | POA: Insufficient documentation

## 2013-11-26 ENCOUNTER — Ambulatory Visit: Payer: PRIVATE HEALTH INSURANCE | Attending: Orthopedic Surgery | Admitting: Physical Therapy

## 2013-11-26 DIAGNOSIS — IMO0001 Reserved for inherently not codable concepts without codable children: Secondary | ICD-10-CM | POA: Insufficient documentation

## 2013-11-26 DIAGNOSIS — R609 Edema, unspecified: Secondary | ICD-10-CM | POA: Insufficient documentation

## 2013-11-26 DIAGNOSIS — M25569 Pain in unspecified knee: Secondary | ICD-10-CM | POA: Insufficient documentation

## 2013-11-30 ENCOUNTER — Ambulatory Visit: Payer: PRIVATE HEALTH INSURANCE | Attending: Orthopedic Surgery | Admitting: Physical Therapy

## 2013-11-30 DIAGNOSIS — M25569 Pain in unspecified knee: Secondary | ICD-10-CM | POA: Insufficient documentation

## 2013-11-30 DIAGNOSIS — IMO0001 Reserved for inherently not codable concepts without codable children: Secondary | ICD-10-CM | POA: Insufficient documentation

## 2013-11-30 DIAGNOSIS — R609 Edema, unspecified: Secondary | ICD-10-CM | POA: Insufficient documentation

## 2013-12-01 ENCOUNTER — Ambulatory Visit: Payer: PRIVATE HEALTH INSURANCE | Attending: Orthopedic Surgery | Admitting: Physical Therapy

## 2013-12-01 DIAGNOSIS — R609 Edema, unspecified: Secondary | ICD-10-CM | POA: Insufficient documentation

## 2013-12-01 DIAGNOSIS — IMO0001 Reserved for inherently not codable concepts without codable children: Secondary | ICD-10-CM | POA: Insufficient documentation

## 2013-12-01 DIAGNOSIS — M25569 Pain in unspecified knee: Secondary | ICD-10-CM | POA: Insufficient documentation

## 2013-12-02 ENCOUNTER — Ambulatory Visit: Payer: PRIVATE HEALTH INSURANCE | Attending: Orthopedic Surgery | Admitting: Physical Therapy

## 2013-12-02 ENCOUNTER — Ambulatory Visit: Payer: PRIVATE HEALTH INSURANCE | Admitting: Physical Therapy

## 2013-12-02 DIAGNOSIS — M25569 Pain in unspecified knee: Secondary | ICD-10-CM | POA: Insufficient documentation

## 2013-12-02 DIAGNOSIS — R609 Edema, unspecified: Secondary | ICD-10-CM | POA: Insufficient documentation

## 2013-12-02 DIAGNOSIS — IMO0001 Reserved for inherently not codable concepts without codable children: Secondary | ICD-10-CM | POA: Insufficient documentation

## 2013-12-03 ENCOUNTER — Ambulatory Visit: Payer: PRIVATE HEALTH INSURANCE | Admitting: Physical Therapy

## 2013-12-07 ENCOUNTER — Ambulatory Visit: Payer: PRIVATE HEALTH INSURANCE | Attending: Orthopedic Surgery | Admitting: Physical Therapy

## 2013-12-07 DIAGNOSIS — IMO0001 Reserved for inherently not codable concepts without codable children: Secondary | ICD-10-CM | POA: Insufficient documentation

## 2013-12-07 DIAGNOSIS — M25569 Pain in unspecified knee: Secondary | ICD-10-CM | POA: Insufficient documentation

## 2013-12-07 DIAGNOSIS — R609 Edema, unspecified: Secondary | ICD-10-CM | POA: Insufficient documentation

## 2013-12-09 ENCOUNTER — Ambulatory Visit: Payer: PRIVATE HEALTH INSURANCE | Attending: Orthopedic Surgery | Admitting: Physical Therapy

## 2013-12-09 DIAGNOSIS — R609 Edema, unspecified: Secondary | ICD-10-CM | POA: Insufficient documentation

## 2013-12-09 DIAGNOSIS — IMO0001 Reserved for inherently not codable concepts without codable children: Secondary | ICD-10-CM | POA: Insufficient documentation

## 2013-12-09 DIAGNOSIS — M25569 Pain in unspecified knee: Secondary | ICD-10-CM | POA: Insufficient documentation

## 2013-12-10 ENCOUNTER — Ambulatory Visit: Payer: PRIVATE HEALTH INSURANCE | Attending: Orthopedic Surgery | Admitting: Physical Therapy

## 2013-12-10 DIAGNOSIS — M25569 Pain in unspecified knee: Secondary | ICD-10-CM | POA: Insufficient documentation

## 2013-12-10 DIAGNOSIS — R609 Edema, unspecified: Secondary | ICD-10-CM | POA: Insufficient documentation

## 2013-12-10 DIAGNOSIS — IMO0001 Reserved for inherently not codable concepts without codable children: Secondary | ICD-10-CM | POA: Insufficient documentation

## 2013-12-14 ENCOUNTER — Ambulatory Visit: Payer: PRIVATE HEALTH INSURANCE | Attending: Orthopedic Surgery | Admitting: Physical Therapy

## 2013-12-14 DIAGNOSIS — R609 Edema, unspecified: Secondary | ICD-10-CM | POA: Insufficient documentation

## 2013-12-14 DIAGNOSIS — IMO0001 Reserved for inherently not codable concepts without codable children: Secondary | ICD-10-CM | POA: Insufficient documentation

## 2013-12-14 DIAGNOSIS — M25569 Pain in unspecified knee: Secondary | ICD-10-CM | POA: Insufficient documentation

## 2013-12-16 ENCOUNTER — Ambulatory Visit: Payer: PRIVATE HEALTH INSURANCE | Attending: Orthopedic Surgery | Admitting: Physical Therapy

## 2013-12-16 DIAGNOSIS — M25569 Pain in unspecified knee: Secondary | ICD-10-CM | POA: Insufficient documentation

## 2013-12-16 DIAGNOSIS — R609 Edema, unspecified: Secondary | ICD-10-CM | POA: Insufficient documentation

## 2013-12-16 DIAGNOSIS — IMO0001 Reserved for inherently not codable concepts without codable children: Secondary | ICD-10-CM | POA: Insufficient documentation

## 2013-12-17 ENCOUNTER — Ambulatory Visit: Payer: PRIVATE HEALTH INSURANCE | Attending: Orthopedic Surgery | Admitting: Physical Therapy

## 2013-12-17 DIAGNOSIS — M25569 Pain in unspecified knee: Secondary | ICD-10-CM | POA: Insufficient documentation

## 2013-12-17 DIAGNOSIS — R609 Edema, unspecified: Secondary | ICD-10-CM | POA: Insufficient documentation

## 2013-12-17 DIAGNOSIS — IMO0001 Reserved for inherently not codable concepts without codable children: Secondary | ICD-10-CM | POA: Insufficient documentation

## 2013-12-31 ENCOUNTER — Encounter: Payer: Self-pay | Admitting: Cardiology

## 2013-12-31 ENCOUNTER — Ambulatory Visit (INDEPENDENT_AMBULATORY_CARE_PROVIDER_SITE_OTHER): Payer: PRIVATE HEALTH INSURANCE | Admitting: Cardiology

## 2013-12-31 VITALS — BP 154/92 | HR 73 | Ht 65.0 in | Wt 206.0 lb

## 2013-12-31 DIAGNOSIS — I359 Nonrheumatic aortic valve disorder, unspecified: Secondary | ICD-10-CM

## 2013-12-31 DIAGNOSIS — E7800 Pure hypercholesterolemia, unspecified: Secondary | ICD-10-CM

## 2013-12-31 DIAGNOSIS — I119 Hypertensive heart disease without heart failure: Secondary | ICD-10-CM

## 2013-12-31 DIAGNOSIS — E119 Type 2 diabetes mellitus without complications: Secondary | ICD-10-CM

## 2013-12-31 MED ORDER — METFORMIN HCL ER 500 MG PO TB24: 500.0000 mg | ORAL_TABLET | Freq: Two times a day (BID) | ORAL | Status: DC

## 2013-12-31 MED ORDER — PANTOPRAZOLE SODIUM 40 MG PO TBEC: 40.0000 mg | DELAYED_RELEASE_TABLET | ORAL | Status: DC

## 2013-12-31 MED ORDER — ROSUVASTATIN CALCIUM 5 MG PO TABS: 5.0000 mg | ORAL_TABLET | Freq: Every day | ORAL | Status: DC

## 2013-12-31 NOTE — Patient Instructions (Signed)
Your physician has requested that you have an echocardiogram. Echocardiography is a painless test that uses sound waves to create images of your heart. It provides your doctor with information about the size and shape of your heart and how well your heart's chambers and valves are working. This procedure takes approximately one hour. There are no restrictions for this procedure.  Your physician wants you to follow-up in: 6 month ov/ekg You will receive a reminder letter in the mail two months in advance. If you don't receive a letter, please call our office to schedule the follow-up appointment.   Your physician recommends that you continue on your current medications as directed. Please refer to the Current Medication list given to you today.

## 2013-12-31 NOTE — Assessment & Plan Note (Signed)
She has a history of mild aortic stenosis.  We will update her echocardiogram.  She has not been having any cardinal symptoms of severe aortic stenosis at this point.  She denies chest pain shortness of breath palpitations dizziness or syncope.

## 2013-12-31 NOTE — Assessment & Plan Note (Signed)
She has not been experiencing any hypoglycemic episodes.  She is overdue to see her endocrinologist.

## 2013-12-31 NOTE — Assessment & Plan Note (Signed)
Blood pressure here is slightly elevated today.  She attributes that to racing to getting here on time.  She states that at home her blood pressure runs 130/80 range.

## 2013-12-31 NOTE — Progress Notes (Signed)
[name] Date of Birth:  05-29-1951 [facility] [address] 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 She has a history of aortic stenosis. Her last echocardiogram on 02/11/12 showed normal LV function with ejection fraction 55-65% and showed moderate but not severe aortic stenosis and there was grade 1 diastolic dysfunction. The patient last spring went to the emergency room with chest pain. After evaluation there it was felt that it was most likely noncardiac and possibly related to a hiatal hernia. She had stopped taking her protonix. and is now back on Protonix and also uses occasional Maalox and her symptoms appear to be improving. The patient has a history of diabetes mellitus followed by Dr. Michiel Sites and she has a history of sleep apnea and uses a CPAP machine. She is on Crestor followed by Dr. Michiel Sites.  She was in her usual state of health until 08/18/13 when she injured her right knee.  It is a Designer, jewellery situation.  She underwent arthroscopic surgery by Dr. supple.  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  Current Outpatient Prescriptions  Medication Sig Dispense Refill  . aspirin EC 81 MG tablet Take 81 mg by mouth 2 (two) times a week.       Marland Kitchen BENICAR HCT 40-12.5 MG per tablet TAKE 1 & 1/2 TABLETS BY MOUTH ONCE DAILY  45 tablet  0  . Cholecalciferol (VITAMIN D) 2000 UNITS CAPS Take 2,000 Units by mouth daily.       . Cyanocobalamin 1000 MCG SUBL Place 1 tablet under the tongue daily.       . metFORMIN (GLUMETZA) 500 MG (MOD) 24 hr tablet Take 500 mg by mouth at bedtime.      . Multiple Vitamin (MULTIVITAMIN) tablet Take 1 tablet by mouth daily.      . Omega-3 Fatty Acids (FISH OIL PO) Take 3  capsules by mouth daily.       Marland Kitchen oxyCODONE-acetaminophen (PERCOCET) 5-325 MG per tablet Take 1-2 tablets by mouth every 4 (four) hours as needed.  40 tablet  0  . pantoprazole (PROTONIX) 40 MG tablet Take 1 tablet (40 mg total) by mouth every other day. Every other day  45 tablet  3  . rosuvastatin (CRESTOR) 5 MG tablet Take 1 tablet (5 mg total) by mouth daily.  90 tablet  3  . vitamin C (ASCORBIC ACID) 500 MG tablet Take 500 mg by mouth 2 (two) times daily.       No current facility-administered medications for this visit.    Allergies  Allergen Reactions  . Crestor [Rosuvastatin Calcium]     Myalgias (high doses)  . Iodine Other (See Comments)    unknown  . Other     Shell fish  . Penicillins Hives and Itching  . Pravachol     Muscle pain    Patient Active Problem List   Diagnosis Date Noted  . Right knee pain 08/18/2013  . Patellar tendinitis 08/18/2013  . Tear of medial meniscus of right knee 08/18/2013  . Left shoulder pain 05/15/2012  . Dyspepsia 02/04/2012  . External hemorrhoid 10/15/2011  . Aortic stenosis   . Hypercholesterolemia   . MVP (mitral valve  prolapse)   . Hypertensive cardiovascular disease   . HX: breast cancer   . History of migraines   . Unspecified disorders of bursae and tendons in shoulder region 09/20/2008  . DIABETES MELLITUS, TYPE II 07/10/2007  . HYPERTENSION 07/10/2007  . AORTIC STENOSIS 07/10/2007    History  Smoking status  . Never Smoker   Smokeless tobacco  . Not on file    History  Alcohol Use No    Family History  Problem Relation Age of Onset  . Hypertension Mother   . Hypertension Father     Review of Systems: Constitutional: no fever chills diaphoresis or fatigue or change in weight.  Head and neck: no hearing loss, no epistaxis, no photophobia or visual disturbance. Respiratory: No cough, shortness of breath or wheezing. Cardiovascular: No chest pain peripheral edema, palpitations. Gastrointestinal: No abdominal  distention, no abdominal pain, no change in bowel habits hematochezia or melena. Genitourinary: No dysuria, no frequency, no urgency, no nocturia. Musculoskeletal:No arthralgias, no back pain, no gait disturbance or myalgias. Neurological: No dizziness, no headaches, no numbness, no seizures, no syncope, no weakness, no tremors. Hematologic: No lymphadenopathy, no easy bruising. Psychiatric: No confusion, no hallucinations, no sleep disturbance.    Physical Exam: Filed Vitals:   12/31/13 0847  BP: 154/92  Pulse: 73   the general appearance reveals a well-developed well-nourished middle-aged woman in no distress.The head and neck exam reveals pupils equal and reactive.  Extraocular movements are full.  There is no scleral icterus.  The mouth and pharynx are normal.  The neck is supple.  The carotids reveal no bruits.  The jugular venous pressure is normal.  The  thyroid is not enlarged.  There is no lymphadenopathy.  The chest is clear to percussion and auscultation.  There are no rales or rhonchi.  Expansion of the chest is symmetrical.  The precordium is quiet.  The first heart sound is normal.  The second heart sound is physiologically split.  There is no  gallop rub or click.  There is a grade 3/6 harsh systolic ejection murmur at the aortic area.  There is no abnormal lift or heave.  The abdomen is soft and nontender.  The bowel sounds are normal.  The liver and spleen are not enlarged.  There are no abdominal masses.  There are no abdominal bruits.  Extremities reveal good pedal pulses.  There is no phlebitis or edema.  There is no cyanosis or clubbing.  Strength is normal and symmetrical in all extremities.  There is no lateralizing weakness.  There are no sensory deficits.  The skin is warm and dry.  There is no rash.     Assessment / Plan: 1. aortic stenosis 2. diabetes mellitus 3. labile hypertension 4. Hypercholesterolemia 5. right knee injury followed by Dr. supple  Plan:  Continue with current medication.  Return for 2-D echocardiogram. Recheck in 6 months for office visit and EKG. She will be getting fasting lab work at her endocrinology offfice in the near future.

## 2014-01-04 ENCOUNTER — Inpatient Hospital Stay (HOSPITAL_COMMUNITY): Admission: RE | Admit: 2014-01-04 | Payer: Self-pay | Source: Ambulatory Visit

## 2014-01-13 ENCOUNTER — Other Ambulatory Visit: Payer: Self-pay | Admitting: Cardiology

## 2014-01-25 ENCOUNTER — Ambulatory Visit: Payer: PRIVATE HEALTH INSURANCE | Attending: Orthopedic Surgery | Admitting: Physical Therapy

## 2014-01-25 DIAGNOSIS — M25569 Pain in unspecified knee: Secondary | ICD-10-CM | POA: Insufficient documentation

## 2014-01-25 DIAGNOSIS — IMO0001 Reserved for inherently not codable concepts without codable children: Secondary | ICD-10-CM | POA: Insufficient documentation

## 2014-01-25 DIAGNOSIS — R609 Edema, unspecified: Secondary | ICD-10-CM | POA: Insufficient documentation

## 2014-01-28 ENCOUNTER — Ambulatory Visit: Payer: PRIVATE HEALTH INSURANCE | Attending: Orthopedic Surgery | Admitting: Physical Therapy

## 2014-01-28 DIAGNOSIS — R609 Edema, unspecified: Secondary | ICD-10-CM | POA: Insufficient documentation

## 2014-01-28 DIAGNOSIS — M25569 Pain in unspecified knee: Secondary | ICD-10-CM | POA: Insufficient documentation

## 2014-01-28 DIAGNOSIS — IMO0001 Reserved for inherently not codable concepts without codable children: Secondary | ICD-10-CM | POA: Insufficient documentation

## 2014-02-01 ENCOUNTER — Ambulatory Visit: Payer: PRIVATE HEALTH INSURANCE | Admitting: Physical Therapy

## 2014-02-04 ENCOUNTER — Ambulatory Visit: Payer: PRIVATE HEALTH INSURANCE | Attending: Orthopedic Surgery | Admitting: Physical Therapy

## 2014-02-04 DIAGNOSIS — M25569 Pain in unspecified knee: Secondary | ICD-10-CM | POA: Insufficient documentation

## 2014-02-04 DIAGNOSIS — R609 Edema, unspecified: Secondary | ICD-10-CM | POA: Insufficient documentation

## 2014-02-04 DIAGNOSIS — IMO0001 Reserved for inherently not codable concepts without codable children: Secondary | ICD-10-CM | POA: Insufficient documentation

## 2014-05-21 ENCOUNTER — Other Ambulatory Visit: Payer: Self-pay | Admitting: Cardiology

## 2014-07-14 ENCOUNTER — Telehealth: Payer: Self-pay | Admitting: Cardiology

## 2014-07-14 NOTE — Telephone Encounter (Signed)
Returned patient's call. Requesting samples of Benicar- HCT 40-12.5. Samples available, pulled 4 boxes of 7 tablets. Patient notified that samples are at the front desk.

## 2014-07-14 NOTE — Telephone Encounter (Signed)
New message    Patient did not disclose any information wants the nurse to call her back

## 2014-07-31 ENCOUNTER — Emergency Department (HOSPITAL_BASED_OUTPATIENT_CLINIC_OR_DEPARTMENT_OTHER)
Admission: EM | Admit: 2014-07-31 | Discharge: 2014-08-01 | Disposition: A | Discharge status: Home / Self Care | Payer: Self-pay | Attending: Emergency Medicine | Admitting: Emergency Medicine

## 2014-07-31 ENCOUNTER — Encounter (HOSPITAL_BASED_OUTPATIENT_CLINIC_OR_DEPARTMENT_OTHER): Payer: Self-pay

## 2014-07-31 DIAGNOSIS — Z853 Personal history of malignant neoplasm of breast: Secondary | ICD-10-CM | POA: Insufficient documentation

## 2014-07-31 DIAGNOSIS — E119 Type 2 diabetes mellitus without complications: Secondary | ICD-10-CM | POA: Insufficient documentation

## 2014-07-31 DIAGNOSIS — Z880 Allergy status to penicillin: Secondary | ICD-10-CM | POA: Insufficient documentation

## 2014-07-31 DIAGNOSIS — M1990 Unspecified osteoarthritis, unspecified site: Secondary | ICD-10-CM | POA: Insufficient documentation

## 2014-07-31 DIAGNOSIS — K219 Gastro-esophageal reflux disease without esophagitis: Secondary | ICD-10-CM | POA: Insufficient documentation

## 2014-07-31 DIAGNOSIS — J029 Acute pharyngitis, unspecified: Secondary | ICD-10-CM | POA: Insufficient documentation

## 2014-07-31 DIAGNOSIS — B340 Adenovirus infection, unspecified: Secondary | ICD-10-CM | POA: Insufficient documentation

## 2014-07-31 DIAGNOSIS — Z8669 Personal history of other diseases of the nervous system and sense organs: Secondary | ICD-10-CM | POA: Insufficient documentation

## 2014-07-31 DIAGNOSIS — J069 Acute upper respiratory infection, unspecified: Secondary | ICD-10-CM | POA: Insufficient documentation

## 2014-07-31 DIAGNOSIS — Z7982 Long term (current) use of aspirin: Secondary | ICD-10-CM | POA: Insufficient documentation

## 2014-07-31 DIAGNOSIS — I119 Hypertensive heart disease without heart failure: Secondary | ICD-10-CM | POA: Insufficient documentation

## 2014-07-31 DIAGNOSIS — Z87442 Personal history of urinary calculi: Secondary | ICD-10-CM | POA: Insufficient documentation

## 2014-07-31 DIAGNOSIS — R011 Cardiac murmur, unspecified: Secondary | ICD-10-CM | POA: Insufficient documentation

## 2014-07-31 DIAGNOSIS — E780 Pure hypercholesterolemia: Secondary | ICD-10-CM | POA: Insufficient documentation

## 2014-07-31 DIAGNOSIS — H109 Unspecified conjunctivitis: Secondary | ICD-10-CM | POA: Insufficient documentation

## 2014-07-31 LAB — RAPID STREP SCREEN (MED CTR MEBANE ONLY): Streptococcus, Group A Screen (Direct): NEGATIVE

## 2014-07-31 LAB — CBG MONITORING, ED: Glucose-Capillary: 101 mg/dL — ABNORMAL HIGH (ref 70–99)

## 2014-07-31 MED ORDER — ERYTHROMYCIN 5 MG/GM OP OINT
TOPICAL_OINTMENT | Freq: Three times a day (TID) | OPHTHALMIC | Status: DC
  Administered 2014-07-31: 1 via OPHTHALMIC
  Filled 2014-07-31: qty 3.5

## 2014-07-31 MED ORDER — GUAIFENESIN ER 600 MG PO TB12
600.0000 mg | ORAL_TABLET | Freq: Two times a day (BID) | ORAL | Status: DC
Start: 2014-07-31 — End: 2015-06-06

## 2014-07-31 MED ORDER — GUAIFENESIN ER 600 MG PO TB12
600.0000 mg | ORAL_TABLET | Freq: Two times a day (BID) | ORAL | Status: DC
  Filled 2014-07-31: qty 1

## 2014-07-31 MED ORDER — GUAIFENESIN 100 MG/5ML PO SOLN
ORAL | Status: AC
  Administered 2014-07-31: 300 mg
  Filled 2014-07-31: qty 15

## 2014-07-31 MED ORDER — HYDROCOD POLST-CHLORPHEN POLST 10-8 MG/5ML PO LQCR
5.0000 mL | Freq: Once | ORAL | Status: AC
  Administered 2014-07-31: 5 mL via ORAL
  Filled 2014-07-31: qty 5

## 2014-07-31 MED ORDER — HYDROCOD POLST-CHLORPHEN POLST 10-8 MG/5ML PO LQCR: 5.0000 mL | Freq: Once | ORAL | Status: DC

## 2014-07-31 NOTE — ED Notes (Signed)
Pt reports sore throat, congestion and right eye pink eye. Pt reports symptoms have been present for 3 days. Denies fever.

## 2014-07-31 NOTE — Discharge Instructions (Signed)
Adenovirus Adenoviruses are viruses that usually cause breathing problems. They may also cause other illnesses, such as stomach flu, bladder infection, and rashes. CAUSES  Adenoviruses are passed by direct contact. This can happen from touching the contaminated hands of someone who has just gone to the bathroom. It can also be passed through contaminated water.  You may have the virus and give it to others without being sick yourself.  Some types of this virus occur naturally in most parts of the world. Most of these infections occur in children.  Epidemics are often centered around swimming pools and small lakes. Symptoms can include fever and pink eye.  Adenovirus 7 is a specific virus gotten by breathing in the virus. It typically causes severe problems in the breathing system. Patients who get the adenovirus by the mouth usually have less severe symptoms. Adenovirus caught by breathing in the virus is more common in the late winter, spring, and early summer. SYMPTOMS  Symptoms vary and can include:   Common cold symptoms.  Pneumonia.  Croup.  Bronchitis. Patients with HIV, transplant patients, and some cancer patients are more likely to have severe problems. Acute respiratory disease (ARD) can be caused by adenovirus in crowded conditions.  These viruses are not easily killed with common cleaning products. DIAGNOSIS  Blood tests can be used to identify the problem.  TREATMENT  Most infections are mild and require no therapy. The symptoms can be treated to make the patient comfortable.  Document Released: 11/03/2002 Document Revised: 11/05/2011 Document Reviewed: 12/16/2013 [facility] Patient Information 2015 [place], [place]. This information is not intended to replace advice given to you by your health care provider. Make sure you discuss any questions you have with your health care provider.  Antibiotic Resistance Antibiotics are drugs. They fight infections caused by bacteria.  Antibiotics greatly reduce illness and death from infectious diseases. Over time, the bacteria that antibiotics once controlled are much harder to kill. CAUSES  Antibiotic resistance occurs when bacteria change in some way. These changes can lessen the abilities of drugs designed to cure infections. The overuse of antibiotics can cause antibiotic resistance. Almost all important bacterial infections in the world are becoming resistant to drugs. Antibiotic resistance has been called one of the world's most pressing public health problems.  Antibiotics should be used to treat bacterial infections. But they are not effective against viral infections. These include the common cold, most sore throats, and the flu. Smart use of antibiotics will control the spread of resistance.  TREATMENT   Only use antibiotics as prescribed by your caregiver.  Talk with your caregiver about antibiotic resistance.  Ask what else you can do to feel better.  Do not take an antibiotic for a viral infection. This could be a cold, cough, or the flu.  Do not save some of your antibiotic for the next time you get sick.  Take an antibiotic exactly as the caregiver tells you.  Do not take an antibiotic that is prescribed for someone else.  Use the antibiotic as directed. Take the correct dose at the scheduled time. SEEK MEDICAL CARE IF:  You react to the antibiotic with:  A rash.  Itching.  An upset stomach. Document Released: 11/03/2002 Document Revised: 12/28/2013 Document Reviewed: 06/07/2008 [facility] Patient Information 2015 [place], [place]. This information is not intended to replace advice given to you by your health care provider. Make sure you discuss any questions you have with your health care provider.  Cool Mist Vaporizers Vaporizers may help relieve  the symptoms of a cough and cold. They add moisture to the air, which helps mucus to become thinner and less sticky. This makes it easier to breathe and  cough up secretions. Cool mist vaporizers do not cause serious burns like hot mist vaporizers, which may also be called steamers or humidifiers. Vaporizers have not been proven to help with colds. You should not use a vaporizer if you are allergic to mold. HOME CARE INSTRUCTIONS  Follow the package instructions for the vaporizer.  Do not use anything other than distilled water in the vaporizer.  Do not run the vaporizer all of the time. This can cause mold or bacteria to grow in the vaporizer.  Clean the vaporizer after each time it is used.  Clean and dry the vaporizer well before storing it.  Stop using the vaporizer if worsening respiratory symptoms develop. Document Released: 05/10/2004 Document Revised: 08/18/2013 Document Reviewed: 12/31/2012 [facility] Patient Information 2015 [place], [place]. This information is not intended to replace advice given to you by your health care provider. Make sure you discuss any questions you have with your health care provider.  Pharyngitis Pharyngitis is redness, pain, and swelling (inflammation) of your pharynx.  CAUSES  Pharyngitis is usually caused by infection. Most of the time, these infections are from viruses (viral) and are part of a cold. However, sometimes pharyngitis is caused by bacteria (bacterial). Pharyngitis can also be caused by allergies. Viral pharyngitis may be spread from person to person by coughing, sneezing, and personal items or utensils (cups, forks, spoons, toothbrushes). Bacterial pharyngitis may be spread from person to person by more intimate contact, such as kissing.  SIGNS AND SYMPTOMS  Symptoms of pharyngitis include:   Sore throat.   Tiredness (fatigue).   Low-grade fever.   Headache.  Joint pain and muscle aches.  Skin rashes.  Swollen lymph nodes.  Plaque-like film on throat or tonsils (often seen with bacterial pharyngitis). DIAGNOSIS  Your health care provider will ask you questions about your  illness and your symptoms. Your medical history, along with a physical exam, is often all that is needed to diagnose pharyngitis. Sometimes, a rapid strep test is done. Other lab tests may also be done, depending on the suspected cause.  TREATMENT  Viral pharyngitis will usually get better in 3-4 days without the use of medicine. Bacterial pharyngitis is treated with medicines that kill germs (antibiotics).  HOME CARE INSTRUCTIONS   Drink enough water and fluids to keep your urine clear or pale yellow.   Only take over-the-counter or prescription medicines as directed by your health care provider:   If you are prescribed antibiotics, make sure you finish them even if you start to feel better.   Do not take aspirin.   Get lots of rest.   Gargle with 8 oz of salt water ( tsp of salt per 1 qt of water) as often as every 1-2 hours to soothe your throat.   Throat lozenges (if you are not at risk for choking) or sprays may be used to soothe your throat. SEEK MEDICAL CARE IF:   You have large, tender lumps in your neck.  You have a rash.  You cough up green, yellow-brown, or bloody spit. SEEK IMMEDIATE MEDICAL CARE IF:   Your neck becomes stiff.  You drool or are unable to swallow liquids.  You vomit or are unable to keep medicines or liquids down.  You have severe pain that does not go away with the use of recommended medicines.  You have trouble breathing (not caused by a stuffy nose). MAKE SURE YOU:   Understand these instructions.  Will watch your condition.  Will get help right away if you are not doing well or get worse. Document Released: 08/13/2005 Document Revised: 06/03/2013 Document Reviewed: 04/20/2013 [facility] Patient Information 2015 [place], [place]. This information is not intended to replace advice given to you by your health care provider. Make sure you discuss any questions you have with your health care provider.  Salt Water Gargle This solution will  help make your mouth and throat feel better. HOME CARE INSTRUCTIONS   Mix 1 teaspoon of salt in 8 ounces of warm water.  Gargle with this solution as much or often as you need or as directed. Swish and gargle gently if you have any sores or wounds in your mouth.  Do not swallow this mixture. Document Released: 05/17/2004 Document Revised: 11/05/2011 Document Reviewed: 10/08/2008 [facility] Patient Information 2015 [place], [place]. This information is not intended to replace advice given to you by your health care provider. Make sure you discuss any questions you have with your health care provider.  Upper Respiratory Infection, Adult An upper respiratory infection (URI) is also sometimes known as the common cold. The upper respiratory tract includes the nose, sinuses, throat, trachea, and bronchi. Bronchi are the airways leading to the lungs. Most people improve within 1 week, but symptoms can last up to 2 weeks. A residual cough may last even longer.  CAUSES Many different viruses can infect the tissues lining the upper respiratory tract. The tissues become irritated and inflamed and often become very moist. Mucus production is also common. A cold is contagious. You can easily spread the virus to others by oral contact. This includes kissing, sharing a glass, coughing, or sneezing. Touching your mouth or nose and then touching a surface, which is then touched by another person, can also spread the virus. SYMPTOMS  Symptoms typically develop 1 to 3 days after you come in contact with a cold virus. Symptoms vary from person to person. They may include:  Runny nose.  Sneezing.  Nasal congestion.  Sinus irritation.  Sore throat.  Loss of voice (laryngitis).  Cough.  Fatigue.  Muscle aches.  Loss of appetite.  Headache.  Low-grade fever. DIAGNOSIS  You might diagnose your own cold based on familiar symptoms, since most people get a cold 2 to 3 times a year. Your caregiver can  confirm this based on your exam. Most importantly, your caregiver can check that your symptoms are not due to another disease such as strep throat, sinusitis, pneumonia, asthma, or epiglottitis. Blood tests, throat tests, and X-rays are not necessary to diagnose a common cold, but they may sometimes be helpful in excluding other more serious diseases. Your caregiver will decide if any further tests are required. RISKS AND COMPLICATIONS  You may be at risk for a more severe case of the common cold if you smoke cigarettes, have chronic heart disease (such as heart failure) or lung disease (such as asthma), or if you have a weakened immune system. The very young and very old are also at risk for more serious infections. Bacterial sinusitis, middle ear infections, and bacterial pneumonia can complicate the common cold. The common cold can worsen asthma and chronic obstructive pulmonary disease (COPD). Sometimes, these complications can require emergency medical care and may be life-threatening. PREVENTION  The best way to protect against getting a cold is to practice good hygiene. Avoid oral or hand contact  with people with cold symptoms. Wash your hands often if contact occurs. There is no clear evidence that vitamin C, vitamin E, echinacea, or exercise reduces the chance of developing a cold. However, it is always recommended to get plenty of rest and practice good nutrition. TREATMENT  Treatment is directed at relieving symptoms. There is no cure. Antibiotics are not effective, because the infection is caused by a virus, not by bacteria. Treatment may include:  Increased fluid intake. Sports drinks offer valuable electrolytes, sugars, and fluids.  Breathing heated mist or steam (vaporizer or shower).  Eating chicken soup or other clear broths, and maintaining good nutrition.  Getting plenty of rest.  Using gargles or lozenges for comfort.  Controlling fevers with ibuprofen or acetaminophen as  directed by your caregiver.  Increasing usage of your inhaler if you have asthma. Zinc gel and zinc lozenges, taken in the first 24 hours of the common cold, can shorten the duration and lessen the severity of symptoms. Pain medicines may help with fever, muscle aches, and throat pain. A variety of non-prescription medicines are available to treat congestion and runny nose. Your caregiver can make recommendations and may suggest nasal or lung inhalers for other symptoms.  HOME CARE INSTRUCTIONS   Only take over-the-counter or prescription medicines for pain, discomfort, or fever as directed by your caregiver.  Use a warm mist humidifier or inhale steam from a shower to increase air moisture. This may keep secretions moist and make it easier to breathe.  Drink enough water and fluids to keep your urine clear or pale yellow.  Rest as needed.  Return to work when your temperature has returned to normal or as your caregiver advises. You may need to stay home longer to avoid infecting others. You can also use a face mask and careful hand washing to prevent spread of the virus. SEEK MEDICAL CARE IF:   After the first few days, you feel you are getting worse rather than better.  You need your caregiver's advice about medicines to control symptoms.  You develop chills, worsening shortness of breath, or brown or red sputum. These may be signs of pneumonia.  You develop yellow or brown nasal discharge or pain in the face, especially when you bend forward. These may be signs of sinusitis.  You develop a fever, swollen neck glands, pain with swallowing, or white areas in the back of your throat. These may be signs of strep throat. SEEK IMMEDIATE MEDICAL CARE IF:   You have a fever.  You develop severe or persistent headache, ear pain, sinus pain, or chest pain.  You develop wheezing, a prolonged cough, cough up blood, or have a change in your usual mucus (if you have chronic lung disease).  You  develop sore muscles or a stiff neck. Document Released: 02/06/2001 Document Revised: 11/05/2011 Document Reviewed: 11/18/2013 [facility] Patient Information 2015 [place], [place]. This information is not intended to replace advice given to you by your health care provider. Make sure you discuss any questions you have with your health care provider.

## 2014-07-31 NOTE — ED Notes (Signed)
Pt coughing and gaging, stated she felt like she was going to vomit.

## 2014-07-31 NOTE — ED Notes (Signed)
CBG result was 101 mg. / dcltr.

## 2014-07-31 NOTE — ED Provider Notes (Signed)
CSN: [id number]     Arrival date & time 07/31/14  2112 History   First MD Initiated Contact with Patient 07/31/14 2312     Chief Complaint  Patient presents with  . Nasal Congestion  . Conjunctivitis     (Consider location/radiation/quality/duration/timing/severity/associated sxs/prior Treatment) HPI 63 yo female presents to the ER from home with complaint of 3 days of sore throat, cough, myalgias, congestion, and 1 day of right eye itching, redness, drainage.  No fevers, no vomiting, diarrhea.  No sick contacts.  Pt has h/o HTN, DM, aortic stenosis.  She reports she is compliant with all medications. Pt requesting zpack. Past Medical History  Diagnosis Date  . Aortic stenosis   . Hypertension   . Diabetes mellitus   . Hypercholesterolemia   . MVP (mitral valve prolapse)   . Hypertensive cardiovascular disease   . HX: breast cancer   . History of migraines   . Heart murmur   . Cancer     breast  . Sleep apnea   . Headache(784.0)     hx migraines , none in past year  . History of kidney stones     passed  2  . GERD (gastroesophageal reflux disease)   . Arthritis     knee   Past Surgical History  Procedure Laterality Date  . Hip fracture surgery Left 1968  . Tubal ligation  1994  . Mastectomy Bilateral 2008    bilateral Dr.Streck  . Mastectomy Bilateral   . Knee arthroscopy Right 11/05/2013    Procedure: ARTHROSCOPY RIGHT KNEE WITH DEBRIDEMENT;  Surgeon: Marin Shutter, MD;  Location: [facility];  Service: Orthopedics;  Laterality: Right;   Family History  Problem Relation Age of Onset  . Hypertension Mother   . Hypertension Father    History  Substance Use Topics  . Smoking status: Never Smoker   . Smokeless tobacco: Not on file  . Alcohol Use: No   OB History    No data available     Review of Systems  All other systems reviewed and are negative. other than listed in HPI    Allergies  Crestor; Iodine; Other; Penicillins; and Pravachol  Home Medications    Prior to Admission medications   Medication Sig Start Date End Date Taking? Authorizing Provider  aspirin EC 81 MG tablet Take 81 mg by mouth 2 (two) times a week.     Historical Provider, MD  BENICAR HCT 40-12.5 MG per tablet TAKE 1 & 1/2 TABLETS BY MOUTH DAILY 05/21/14   Darlin Coco, MD  Cholecalciferol (VITAMIN D) 2000 UNITS CAPS Take 2,000 Units by mouth daily.     Historical Provider, MD  Cyanocobalamin 1000 MCG SUBL Place 1 tablet under the tongue daily.     Historical Provider, MD  metFORMIN (GLUCOPHAGE XR) 500 MG 24 hr tablet Take 1 tablet (500 mg total) by mouth 2 (two) times daily. 12/31/13   Darlin Coco, MD  Multiple Vitamin (MULTIVITAMIN) tablet Take 1 tablet by mouth daily.    Historical Provider, MD  Omega-3 Fatty Acids (FISH OIL PO) Take 3 capsules by mouth daily.     Historical Provider, MD  oxyCODONE-acetaminophen (PERCOCET) 5-325 MG per tablet Take 1-2 tablets by mouth every 4 (four) hours as needed. 11/05/13   Olivia Mackie Shuford, PA-C  pantoprazole (PROTONIX) 40 MG tablet Take 1 tablet (40 mg total) by mouth every other day. Every other day 12/31/13   Darlin Coco, MD  rosuvastatin (CRESTOR) 5 MG tablet Take 1 tablet (  5 mg total) by mouth daily. 12/31/13   Darlin Coco, MD  vitamin C (ASCORBIC ACID) 500 MG tablet Take 500 mg by mouth 2 (two) times daily.    Historical Provider, MD   BP 172/86 mmHg  Pulse 72  Temp(Src) 99.6 F (37.6 C) (Oral)  Resp 18  Ht 5\' 4"  (1.626 m)  Wt 210 lb (95.255 kg)  BMI 36.03 kg/m2  SpO2 100% Physical Exam  Constitutional: She is oriented to person, place, and time. She appears well-developed and well-nourished. No distress.  HENT:  Head: Normocephalic and atraumatic.  Right Ear: External ear normal.  Left Ear: External ear normal.  Nose: Nose normal.  Mouth/Throat: Oropharynx is clear and moist. No oropharyngeal exudate (mild erythema to posterior pharynx).  Eyes: EOM are normal. Pupils are equal, round, and reactive to light.  Right eye exhibits discharge. Left eye exhibits no discharge.  Right eye with mild conjunctival erythema, watery drainage  Neck: Normal range of motion. Neck supple. No JVD present. No tracheal deviation present. No thyromegaly present.  Cardiovascular: Normal rate, regular rhythm and intact distal pulses.  Exam reveals no gallop and no friction rub.   Murmur heard. Pulmonary/Chest: Effort normal and breath sounds normal. No stridor. No respiratory distress. She has no wheezes. She has no rales. She exhibits no tenderness.  Abdominal: Soft. Bowel sounds are normal. She exhibits no distension and no mass. There is no tenderness. There is no rebound and no guarding.  Musculoskeletal: Normal range of motion. She exhibits no edema or tenderness.  Lymphadenopathy:    She has no cervical adenopathy.  Neurological: She is alert and oriented to person, place, and time. She displays normal reflexes. She exhibits normal muscle tone. Coordination normal.  Skin: Skin is warm and dry. No rash noted. No erythema. No pallor.  Psychiatric: She has a normal mood and affect. Her behavior is normal. Judgment and thought content normal.  Nursing note and vitals reviewed.   ED Course  Procedures (including critical care time) Labs Review Labs Reviewed  CBG MONITORING, ED - Abnormal; Notable for the following:    Glucose-Capillary 101 (*)    All other components within normal limits  RAPID STREP SCREEN  CULTURE, GROUP A STREP    Imaging Review No results found.   EKG Interpretation None      MDM   Final diagnoses:  URI (upper respiratory infection)  Viral pharyngitis  Adenovirus infection  Conjunctivitis of right eye    63 yo female with viral URI, most likely adenovirus, conjunctivitis.  Pt requesting antibiotics, which I do not feel will be helpful in her case with viral etiology.  Pt is concerned about her "pink eye", will rx erythromycin ointment, more for lubrication and to cover for any  secondary infection.   Kalman Drape, MD 08/01/14 [phone]

## 2014-08-01 NOTE — ED Notes (Signed)
EMT[employer] form filled out on this pt was a mistaken entry pt discharged to home condition improved and stable.

## 2014-08-03 LAB — CULTURE, GROUP A STREP

## 2015-01-18 ENCOUNTER — Emergency Department (HOSPITAL_COMMUNITY)
Admission: EM | Admit: 2015-01-18 | Discharge: 2015-01-18 | Discharge status: Against Medical Advice | Payer: 59 | Attending: Emergency Medicine | Admitting: Emergency Medicine

## 2015-01-18 ENCOUNTER — Encounter (HOSPITAL_COMMUNITY): Payer: Self-pay | Admitting: Physical Medicine and Rehabilitation

## 2015-01-18 ENCOUNTER — Emergency Department (HOSPITAL_COMMUNITY): Payer: 59

## 2015-01-18 DIAGNOSIS — Z853 Personal history of malignant neoplasm of breast: Secondary | ICD-10-CM | POA: Diagnosis not present

## 2015-01-18 DIAGNOSIS — K219 Gastro-esophageal reflux disease without esophagitis: Secondary | ICD-10-CM | POA: Insufficient documentation

## 2015-01-18 DIAGNOSIS — Z79899 Other long term (current) drug therapy: Secondary | ICD-10-CM | POA: Diagnosis not present

## 2015-01-18 DIAGNOSIS — Z880 Allergy status to penicillin: Secondary | ICD-10-CM | POA: Insufficient documentation

## 2015-01-18 DIAGNOSIS — E780 Pure hypercholesterolemia: Secondary | ICD-10-CM | POA: Diagnosis not present

## 2015-01-18 DIAGNOSIS — Z87442 Personal history of urinary calculi: Secondary | ICD-10-CM | POA: Diagnosis not present

## 2015-01-18 DIAGNOSIS — E785 Hyperlipidemia, unspecified: Secondary | ICD-10-CM | POA: Diagnosis not present

## 2015-01-18 DIAGNOSIS — I10 Essential (primary) hypertension: Secondary | ICD-10-CM | POA: Diagnosis not present

## 2015-01-18 DIAGNOSIS — G43909 Migraine, unspecified, not intractable, without status migrainosus: Secondary | ICD-10-CM | POA: Diagnosis not present

## 2015-01-18 DIAGNOSIS — E119 Type 2 diabetes mellitus without complications: Secondary | ICD-10-CM | POA: Diagnosis not present

## 2015-01-18 DIAGNOSIS — M179 Osteoarthritis of knee, unspecified: Secondary | ICD-10-CM | POA: Insufficient documentation

## 2015-01-18 DIAGNOSIS — D649 Anemia, unspecified: Secondary | ICD-10-CM | POA: Insufficient documentation

## 2015-01-18 DIAGNOSIS — R011 Cardiac murmur, unspecified: Secondary | ICD-10-CM | POA: Insufficient documentation

## 2015-01-18 DIAGNOSIS — R079 Chest pain, unspecified: Secondary | ICD-10-CM | POA: Diagnosis present

## 2015-01-18 LAB — CBC WITH DIFFERENTIAL/PLATELET
BASOS ABS: 0 10*3/uL (ref 0.0–0.1)
Basophils Relative: 0 % (ref 0–1)
Eosinophils Absolute: 0.1 10*3/uL (ref 0.0–0.7)
Eosinophils Relative: 2 % (ref 0–5)
HCT: 31.9 % — ABNORMAL LOW (ref 36.0–46.0)
Hemoglobin: 10.7 g/dL — ABNORMAL LOW (ref 12.0–15.0)
LYMPHS PCT: 35 % (ref 12–46)
Lymphs Abs: 2.2 10*3/uL (ref 0.7–4.0)
MCH: 28.2 pg (ref 26.0–34.0)
MCHC: 33.5 g/dL (ref 30.0–36.0)
MCV: 84.2 fL (ref 78.0–100.0)
MONO ABS: 0.4 10*3/uL (ref 0.1–1.0)
MONOS PCT: 6 % (ref 3–12)
NEUTROS ABS: 3.5 10*3/uL (ref 1.7–7.7)
NEUTROS PCT: 57 % (ref 43–77)
PLATELETS: 200 10*3/uL (ref 150–400)
RBC: 3.79 MIL/uL — ABNORMAL LOW (ref 3.87–5.11)
RDW: 13.4 % (ref 11.5–15.5)
WBC: 6.2 10*3/uL (ref 4.0–10.5)

## 2015-01-18 LAB — COMPREHENSIVE METABOLIC PANEL
ALT: 27 U/L (ref 14–54)
ANION GAP: 12 (ref 5–15)
AST: 23 U/L (ref 15–41)
Albumin: 4.1 g/dL (ref 3.5–5.0)
Alkaline Phosphatase: 125 U/L (ref 38–126)
BUN: 18 mg/dL (ref 6–20)
CHLORIDE: 103 mmol/L (ref 101–111)
CO2: 23 mmol/L (ref 22–32)
Calcium: 9.5 mg/dL (ref 8.9–10.3)
Creatinine, Ser: 1.18 mg/dL — ABNORMAL HIGH (ref 0.44–1.00)
GFR calc non Af Amer: 48 mL/min — ABNORMAL LOW (ref >60)
GFR, EST AFRICAN AMERICAN: 55 mL/min — AB (ref >60)
Glucose, Bld: 105 mg/dL — ABNORMAL HIGH (ref 65–99)
Potassium: 3.5 mmol/L (ref 3.5–5.1)
Sodium: 138 mmol/L (ref 135–145)
Total Bilirubin: 0.6 mg/dL (ref 0.3–1.2)
Total Protein: 7.6 g/dL (ref 6.5–8.1)

## 2015-01-18 LAB — I-STAT TROPONIN, ED: Troponin i, poc: 0 ng/mL (ref 0.00–0.08)

## 2015-01-18 MED ORDER — GI COCKTAIL ~~LOC~~
30.0000 mL | Freq: Once | ORAL | Status: AC
  Administered 2015-01-18: 30 mL via ORAL
  Filled 2015-01-18: qty 30

## 2015-01-18 NOTE — ED Provider Notes (Signed)
CSN: [id number]     Arrival date & time 01/18/15  1236 History   First MD Initiated Contact with Patient 01/18/15 1554     Chief Complaint  Patient presents with  . Chest Pain     (Consider location/radiation/quality/duration/timing/severity/associated sxs/prior Treatment) HPI  [name] is a 64 y.o. female with PMH of hypertension, diabetes, dyslipidemia, aortic stenosis, mitral valve prolapse presenting with chest pain that started 7 PM last night at rest. Pain was described as sharp radiating to left shoulder. Patient took 324 aspirin with mild improvement of the pain but did not resolve it. Patient states the pain is better with activity and worse with rest and eating. She endorses some nausea but no vomiting. No diaphoresis or shortness of breath. No fevers or chills. No difficulty breathing. No hemoptysis. Patient's cardiologist who she saw earlier this month. Patient states she has appointments one month from today and plan for echo. She denies any MI, stent placement.   Past Medical History  Diagnosis Date  . Aortic stenosis   . Hypertension   . Diabetes mellitus   . Hypercholesterolemia   . MVP (mitral valve prolapse)   . Hypertensive cardiovascular disease   . HX: breast cancer   . History of migraines   . Heart murmur   . Cancer     breast  . Sleep apnea   . Headache(784.0)     hx migraines , none in past year  . History of kidney stones     passed  2  . GERD (gastroesophageal reflux disease)   . Arthritis     knee   Past Surgical History  Procedure Laterality Date  . Hip fracture surgery Left 1968  . Tubal ligation  1994  . Mastectomy Bilateral 2008    bilateral Dr.Streck  . Mastectomy Bilateral   . Knee arthroscopy Right 11/05/2013    Procedure: ARTHROSCOPY RIGHT KNEE WITH DEBRIDEMENT;  Surgeon: Marin Shutter, MD;  Location: [facility];  Service: Orthopedics;  Laterality: Right;   Family History  Problem Relation Age of Onset  . Hypertension  Mother   . Hypertension Father    History  Substance Use Topics  . Smoking status: Never Smoker   . Smokeless tobacco: Not on file  . Alcohol Use: No   OB History    No data available     Review of Systems 10 Systems reviewed and are negative for acute change except as noted in the HPI.    Allergies  Crestor; Iodine; Other; Penicillins; and Pravachol  Home Medications   Prior to Admission medications   Medication Sig Start Date End Date Taking? Authorizing Provider  aspirin EC 81 MG tablet Take 81 mg by mouth 2 (two) times a week.     Historical Provider, MD  BENICAR HCT 40-12.5 MG per tablet TAKE 1 & 1/2 TABLETS BY MOUTH DAILY 05/21/14   Darlin Coco, MD  chlorpheniramine-HYDROcodone (TUSSIONEX) 10-8 MG/5ML LQCR Take 5 mLs by mouth once. 07/31/14   Linton Flemings, MD  Cholecalciferol (VITAMIN D) 2000 UNITS CAPS Take 2,000 Units by mouth daily.     Historical Provider, MD  Cyanocobalamin 1000 MCG SUBL Place 1 tablet under the tongue daily.     Historical Provider, MD  guaiFENesin (MUCINEX) 600 MG 12 hr tablet Take 1 tablet (600 mg total) by mouth 2 (two) times daily. 07/31/14   Linton Flemings, MD  metFORMIN (GLUCOPHAGE XR) 500 MG 24 hr tablet Take 1 tablet (500 mg total) by mouth 2 (  two) times daily. 12/31/13   Darlin Coco, MD  Multiple Vitamin (MULTIVITAMIN) tablet Take 1 tablet by mouth daily.    Historical Provider, MD  Omega-3 Fatty Acids (FISH OIL PO) Take 3 capsules by mouth daily.     Historical Provider, MD  oxyCODONE-acetaminophen (PERCOCET) 5-325 MG per tablet Take 1-2 tablets by mouth every 4 (four) hours as needed. 11/05/13   Olivia Mackie Shuford, PA-C  pantoprazole (PROTONIX) 40 MG tablet Take 1 tablet (40 mg total) by mouth every other day. Every other day 12/31/13   Darlin Coco, MD  rosuvastatin (CRESTOR) 5 MG tablet Take 1 tablet (5 mg total) by mouth daily. 12/31/13   Darlin Coco, MD  vitamin C (ASCORBIC ACID) 500 MG tablet Take 500 mg by mouth 2 (two) times daily.     Historical Provider, MD   BP 181/98 mmHg  Pulse 60  Temp(Src) 98.3 F (36.8 C) (Oral)  Resp 14  SpO2 100% Physical Exam  Constitutional: She appears well-developed and well-nourished. No distress.  HENT:  Head: Normocephalic and atraumatic.  Eyes: Conjunctivae and EOM are normal. Right eye exhibits no discharge. Left eye exhibits no discharge.  Neck: No JVD present.  Cardiovascular: Normal rate and regular rhythm.   Murmur heard. No leg swelling or tenderness. Negative Homan's sign.  Pulmonary/Chest: Effort normal and breath sounds normal. No respiratory distress. She has no wheezes.  Abdominal: Soft. Bowel sounds are normal. She exhibits no distension. There is no tenderness.  Neurological: She is alert. She exhibits normal muscle tone. Coordination normal.  Skin: Skin is warm and dry. She is not diaphoretic.  Nursing note and vitals reviewed.   ED Course  Procedures (including critical care time) Labs Review Labs Reviewed  CBC WITH DIFFERENTIAL/PLATELET - Abnormal; Notable for the following:    RBC 3.79 (*)    Hemoglobin 10.7 (*)    HCT 31.9 (*)    All other components within normal limits  COMPREHENSIVE METABOLIC PANEL - Abnormal; Notable for the following:    Glucose, Bld 105 (*)    Creatinine, Ser 1.18 (*)    GFR calc non Af Amer 48 (*)    GFR calc Af Amer 55 (*)    All other components within normal limits  I-STAT TROPOININ, ED    Imaging Review Dg Chest 2 View  01/18/2015   CLINICAL DATA:  Left-sided chest pain radiating to shoulder. Pain beginning this morning. Labored respirations.  EXAM: CHEST  2 VIEW  COMPARISON:  11/03/2013 and 01/22/2012  FINDINGS: Lungs are adequately inflated without focal consolidation or effusion. Possible 5 mm nodular density over the lateral left lower lung unchanged compared to 11/03/2013. Cardiomediastinal silhouette is within normal. Remainder of the exam is unchanged.  IMPRESSION: No active cardiopulmonary disease.  Possible 5 mm  nodule over the left lower lung unchanged from 11/03/2013. Recommend followup chest x-ray 6 months.   Electronically Signed   By: Marin Olp M.D.   On: 01/18/2015 14:15     EKG Interpretation   Date/Time:  Tuesday Jan 18 2015 12:43:09 EDT Ventricular Rate:  67 PR Interval:  138 QRS Duration: 74 QT Interval:  416 QTC Calculation: 439 R Axis:   13 Text Interpretation:  Normal sinus rhythm Nonspecific T wave abnormality  Abnormal ECG since last tracing no significant change Confirmed by Eulis Foster   MD, Vira Agar ([id number]) on 01/18/2015 4:18:40 PM      MDM   Final diagnoses:  Chest pain, unspecified chest pain type   Patient with past medical history  of aortic stenosis, hypertension, diabetes, dyslipidemia presenting with chest pain since last night at 7 PM is constant. No exertional symptoms. No shortness of breath. VSS. Regular rate and rhythm and lungs clear to auscultation bilaterally. No JVD or peripheral edema. She resting comfortably in bed. Negative troponin and EKG without significant change. Patient with mild anemia and bump in creatinine which patient states normal for her. Chest x-ray with nodule which patient states she has periodically monitored. Chest pain is atypical for ACS, aortic dissection, PE. Case discussed with Dr. Eulis Foster who agrees and will evaluate the patient.  Patient left before reevaluation. 20-25 mins after my initial evaluation. She states she is unable to wait any longer and left AMA. She states she has 64yo others that she must take care of. She left before I could speak with her. The nurse notified me of such. Patient left AMA. We have previously discussed return precautions. Patient does have a cardiac follow-up in one month. We discussed rescheduling for earlier appointment.     Al Corpus, PA-C 01/18/15 1647  Daleen Bo, MD 01/19/15 [phone]

## 2015-01-18 NOTE — ED Notes (Signed)
Pt states she is unable to wait any longer, left ama. Provider creech notified

## 2015-01-18 NOTE — ED Notes (Signed)
Pt presents to department for evaluation of L sided chest pain radiating to L shoulder. Onset this morning. 5/10 chest pain upon arrival to ED. Respirations unlabored. Pt is alert and oriented x4.

## 2015-01-18 NOTE — ED Notes (Signed)
PA at bedside.

## 2015-01-31 ENCOUNTER — Other Ambulatory Visit: Payer: Self-pay

## 2015-01-31 ENCOUNTER — Telehealth: Payer: Self-pay | Admitting: Cardiology

## 2015-01-31 NOTE — Telephone Encounter (Signed)
Attempted to call patient. Had to leave voicemail.

## 2015-01-31 NOTE — Telephone Encounter (Signed)
New Message       Pt calling wanting samples of Benicar. Please call back and advise.

## 2015-01-31 NOTE — Telephone Encounter (Signed)
Patient returned my call. Samples of Benicar-hct 40/12.5mg  provided. She has [employer], but not thru [employer]. I asked her to bring her new card when she comes for her appointment 02/23/15. She voiced understanding.

## 2015-02-23 ENCOUNTER — Ambulatory Visit (INDEPENDENT_AMBULATORY_CARE_PROVIDER_SITE_OTHER): Payer: 59 | Admitting: Cardiology

## 2015-02-23 ENCOUNTER — Encounter: Payer: Self-pay | Admitting: Cardiology

## 2015-02-23 VITALS — BP 140/86 | HR 65 | Ht 66.0 in | Wt 197.4 lb

## 2015-02-23 DIAGNOSIS — I359 Nonrheumatic aortic valve disorder, unspecified: Secondary | ICD-10-CM | POA: Diagnosis not present

## 2015-02-23 MED ORDER — ROSUVASTATIN CALCIUM 5 MG PO TABS: 5.0000 mg | ORAL_TABLET | Freq: Every day | ORAL | Status: DC

## 2015-02-23 MED ORDER — ONDANSETRON 8 MG PO TBDP: 8.0000 mg | ORAL_TABLET | Freq: Three times a day (TID) | ORAL | Status: DC | PRN

## 2015-02-23 MED ORDER — AMLODIPINE BESYLATE 5 MG PO TABS: 5.0000 mg | ORAL_TABLET | Freq: Every day | ORAL | Status: DC

## 2015-02-23 NOTE — Patient Instructions (Addendum)
Medication Instructions:  START AMLODIPINE 5 MG DAILY   ZOFRAN 8 MG 1 EVERY 8 HOURS AS NEEDED FOR SEVERE NAUSEA   DECREASE BENICAR TO ONE DAILY   Labwork: NONE   Testing/Procedures:  Your physician has requested that you have an echocardiogram. Echocardiography is a painless test that uses sound waves to create images of your heart. It provides your doctor with information about the size and shape of your heart and how well your heart's chambers and valves are working. This procedure takes approximately one hour. There are no restrictions for this procedure.   Follow-Up: Your physician wants you to follow-up in: 6 months with fasting labs (lp/bmet/hfp)  You will receive a reminder letter in the mail two months in advance. If you don't receive a letter, please call our office to schedule the follow-up appointment.

## 2015-02-23 NOTE — Progress Notes (Signed)
Cardiology Office Note   Date:  02/23/2015   ID:  KEILY LEPP, DOB 11/08/1950, MRN [id number]  PCP:  Henrine Screws, MD  Cardiologist: Darlin Coco MD  No chief complaint on file.     History of Present Illness: [name] is a 64 y.o. female who presents for a six-month follow-up office visit. 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 She has a history of aortic stenosis. Her last echocardiogram on 02/11/12 showed normal LV function with ejection fraction 55-65% and showed moderate but not severe aortic stenosis and there was grade 1 diastolic dysfunction. The patient last spring went to the emergency room with chest pain. After evaluation there it was felt that it was most likely noncardiac and possibly related to a hiatal hernia. She had stopped taking her protonix. and is now back on Protonix and also uses occasional Maalox and her symptoms appear to be improving. The patient has a history of diabetes mellitus followed by Dr. Michiel Sites and she has a history of sleep apnea and uses a CPAP machine. She is on Crestor followed by Dr. Michiel Sites. She was in her usual state of health until 08/18/13 when she injured her right knee. It is a [employer]er[employer] situation. She underwent arthroscopic surgery by Dr. supple. She has now been told that she will need a total knee replacement on the right knee.  She has had a second opinion.  It is a workman's comp case and so the final decision has not yet been made. The patient was seen in the emergency room 01/18/2015 for chest pain.  Workup was negative.  She was given a GI cocktail in the chest pain resolved dramatically.  Chest x-ray at that time showed a normal heart size and EKG showed nonspecific T-wave flattening.  Her hemoglobin was 10.7 signifying mild anemia and she was told to eat iron rich food. She is on Crestor.  She has had recent liver function studies  which are normal and we are refilling Crestor at her request.  She has also been having some nausea and is requesting something for nausea and we will try some Zofran ODT 8 mg every 8 hours as needed for severe nausea.  Her blood pressure has been running high.  We will add amlodipine 5 mg daily to her regimen.   Past Medical History  Diagnosis Date  . Aortic stenosis   . Hypertension   . Diabetes mellitus   . Hypercholesterolemia   . MVP (mitral valve prolapse)   . Hypertensive cardiovascular disease   . HX: breast cancer   . History of migraines   . Heart murmur   . Cancer     breast  . Sleep apnea   . Headache(784.0)     hx migraines , none in past year  . History of kidney stones     passed  2  . GERD (gastroesophageal reflux disease)   . Arthritis     knee    Past Surgical History  Procedure Laterality Date  . Hip fracture surgery Left 1968  . Tubal ligation  1994  . Mastectomy Bilateral 2008    bilateral Dr.Streck  . Mastectomy Bilateral   . Knee arthroscopy Right 11/05/2013    Procedure: ARTHROSCOPY RIGHT KNEE WITH DEBRIDEMENT;  Surgeon: Marin Shutter, MD;  Location: [facility];  Service: Orthopedics;  Laterality: Right;     Current Outpatient Prescriptions  Medication Sig Dispense Refill  .  chlorpheniramine-HYDROcodone (TUSSIONEX) 10-8 MG/5ML LQCR Take 5 mLs by mouth once. 115 mL 0  . Cholecalciferol (VITAMIN D) 2000 UNITS CAPS Take 2,000 Units by mouth daily.     . Cyanocobalamin 1000 MCG SUBL Place 1 tablet under the tongue daily.     . cyclobenzaprine (FLEXERIL) 5 MG tablet Take 5 mg by mouth 3 (three) times daily as needed.  0  . guaiFENesin (MUCINEX) 600 MG 12 hr tablet Take 1 tablet (600 mg total) by mouth 2 (two) times daily. 30 tablet 0  . ibuprofen (ADVIL,MOTRIN) 800 MG tablet Take 800 mg by mouth 3 (three) times daily as needed (pain).   0  . metFORMIN (GLUCOPHAGE XR) 500 MG 24 hr tablet Take 1 tablet (500 mg total) by mouth 2 (two) times daily. 180 tablet 3   . Multiple Vitamin (MULTIVITAMIN) tablet Take 1 tablet by mouth daily.    Marland Kitchen olmesartan-hydrochlorothiazide (BENICAR HCT) 40-12.5 MG per tablet Take 1 tablet by mouth.    . Omega-3 Fatty Acids (FISH OIL PO) Take 3 capsules by mouth daily.     Marland Kitchen oxyCODONE-acetaminophen (PERCOCET) 10-325 MG per tablet Take 1-2 tablets by mouth every 4 (four) hours as needed (pain).   0  . pantoprazole (PROTONIX) 40 MG tablet Take 1 tablet (40 mg total) by mouth every other day. Every other day 45 tablet 3  . rosuvastatin (CRESTOR) 5 MG tablet Take 1 tablet (5 mg total) by mouth daily. 90 tablet 3  . valACYclovir (VALTREX) 500 MG tablet Take 500 mg by mouth daily as needed (cold sore).   12  . vitamin C (ASCORBIC ACID) 500 MG tablet Take 500 mg by mouth 2 (two) times daily.    Marland Kitchen amLODipine (NORVASC) 5 MG tablet Take 1 tablet (5 mg total) by mouth daily. 90 tablet 3  . ondansetron (ZOFRAN ODT) 8 MG disintegrating tablet Take 1 tablet (8 mg total) by mouth every 8 (eight) hours as needed for nausea. 10 tablet 1   No current facility-administered medications for this visit.    Allergies:   Other; Crestor; Iodine; Penicillins; and Pravachol    Social History:  The patient  reports that she has never smoked. She does not have any smokeless tobacco history on file. She reports that she does not drink alcohol or use illicit drugs.   Family History:  The patient's family history includes Hypertension in her father and mother.    ROS:  Please see the history of present illness.   Otherwise, review of systems are positive for none.   All other systems are reviewed and negative.    PHYSICAL EXAM: VS:  BP 140/86 mmHg  Pulse 65  Ht 5\' 6"  (1.676 m)  Wt 197 lb 6.4 oz (89.54 kg)  BMI 31.88 kg/m2 , BMI Body mass index is 31.88 kg/(m^2). GEN: Well nourished, well developed, in no acute distress HEENT: normal Neck: no JVD, carotid bruits, or masses Cardiac: RRR; grade 2/6 systolic ejection murmur at the base; no aortic  insufficiency. Respiratory:  clear to auscultation bilaterally, normal work of breathing GI: soft, nontender, nondistended, + BS MS: no deformity or atrophy Skin: warm and dry, no rash Neuro:  Strength and sensation are intact Psych: euthymic mood, full affect   EKG:  EKG is ordered today. The ekg ordered today demonstrates normal sinus rhythm with nonspecific T-wave flattening since 01/18/15, no significant change.   Recent Labs: 01/18/2015: ALT 27; BUN 18; Creatinine, Ser 1.18*; Hemoglobin 10.7*; Platelets 200; Potassium 3.5; Sodium 138  Lipid Panel    Component Value Date/Time   CHOL 276* 09/10/2011 1133   TRIG 116.0 09/10/2011 1133   HDL 60.60 09/10/2011 1133   CHOLHDL 5 09/10/2011 1133   VLDL 23.2 09/10/2011 1133   LDLDIRECT 193.1 09/10/2011 1133      Wt Readings from Last 3 Encounters:  02/23/15 197 lb 6.4 oz (89.54 kg)  07/31/14 210 lb (95.255 kg)  12/31/13 206 lb (93.441 kg)         ASSESSMENT AND PLAN:  1. aortic stenosis.  Her last echocardiogram was 02/11/12 2. diabetes mellitus followed by Dr. Michiel Sites 3. labile hypertension 4. Hypercholesterolemia 5. right knee injury followed by Dr. Onnie Graham.  She anticipates that she will now need a right total knee replacement.  It will be done by a different orthopedist 6.  Nausea  Plan: Continue with current medication.Add amlodipine 5 mg daily for hypertension. Return soon for 2-D echocardiogram. Recheck in 6 months for office visit lipid panel hepatic function panel and basal metabolic panel    Current medicines are reviewed at length with the patient today.  The patient does not have concerns regarding medicines.  The following changes have been made:  no change  Labs/ tests ordered today include:   Orders Placed This Encounter  Procedures  . EKG 12-Lead  . ECHOCARDIOGRAM COMPLETE       Signed, Darlin Coco MD 02/23/2015 6:39 PM    [facility] [place],  [place], [place]  [address] Phone: [phone]; Fax: [phone]

## 2015-03-25 ENCOUNTER — Ambulatory Visit (HOSPITAL_COMMUNITY): Payer: 59

## 2015-03-28 ENCOUNTER — Other Ambulatory Visit (HOSPITAL_COMMUNITY): Payer: 59

## 2015-04-01 ENCOUNTER — Other Ambulatory Visit: Payer: Self-pay

## 2015-04-01 ENCOUNTER — Ambulatory Visit (HOSPITAL_COMMUNITY): Payer: 59 | Attending: Cardiovascular Disease

## 2015-04-01 DIAGNOSIS — I10 Essential (primary) hypertension: Secondary | ICD-10-CM | POA: Diagnosis not present

## 2015-04-01 DIAGNOSIS — E785 Hyperlipidemia, unspecified: Secondary | ICD-10-CM | POA: Diagnosis not present

## 2015-04-01 DIAGNOSIS — I352 Nonrheumatic aortic (valve) stenosis with insufficiency: Secondary | ICD-10-CM | POA: Diagnosis not present

## 2015-04-01 DIAGNOSIS — E119 Type 2 diabetes mellitus without complications: Secondary | ICD-10-CM | POA: Diagnosis not present

## 2015-04-01 DIAGNOSIS — I359 Nonrheumatic aortic valve disorder, unspecified: Secondary | ICD-10-CM

## 2015-04-01 DIAGNOSIS — I358 Other nonrheumatic aortic valve disorders: Secondary | ICD-10-CM | POA: Diagnosis present

## 2015-05-19 ENCOUNTER — Other Ambulatory Visit: Payer: Self-pay | Admitting: Internal Medicine

## 2015-05-19 DIAGNOSIS — R109 Unspecified abdominal pain: Secondary | ICD-10-CM

## 2015-05-23 ENCOUNTER — Other Ambulatory Visit (HOSPITAL_COMMUNITY): Payer: Self-pay | Admitting: Internal Medicine

## 2015-05-23 ENCOUNTER — Ambulatory Visit
Admission: RE | Admit: 2015-05-23 | Discharge: 2015-05-23 | Disposition: A | Discharge status: Home / Self Care | Payer: 59 | Source: Ambulatory Visit | Attending: Internal Medicine | Admitting: Internal Medicine

## 2015-05-23 DIAGNOSIS — R140 Abdominal distension (gaseous): Secondary | ICD-10-CM

## 2015-05-23 DIAGNOSIS — R109 Unspecified abdominal pain: Secondary | ICD-10-CM

## 2015-05-26 ENCOUNTER — Ambulatory Visit (HOSPITAL_COMMUNITY)
Admission: RE | Admit: 2015-05-26 | Discharge: 2015-05-26 | Disposition: A | Discharge status: Home / Self Care | Payer: 59 | Source: Ambulatory Visit | Attending: Internal Medicine | Admitting: Internal Medicine

## 2015-05-26 ENCOUNTER — Encounter (HOSPITAL_COMMUNITY): Payer: Self-pay

## 2015-05-26 DIAGNOSIS — R140 Abdominal distension (gaseous): Secondary | ICD-10-CM | POA: Diagnosis not present

## 2015-05-26 DIAGNOSIS — R918 Other nonspecific abnormal finding of lung field: Secondary | ICD-10-CM | POA: Insufficient documentation

## 2015-05-26 DIAGNOSIS — K869 Disease of pancreas, unspecified: Secondary | ICD-10-CM | POA: Insufficient documentation

## 2015-05-26 DIAGNOSIS — R109 Unspecified abdominal pain: Secondary | ICD-10-CM | POA: Diagnosis present

## 2015-05-26 MED ORDER — IOHEXOL 300 MG/ML  SOLN
100.0000 mL | Freq: Once | INTRAMUSCULAR | Status: AC | PRN
  Administered 2015-05-26: 100 mL via INTRAVENOUS

## 2015-05-30 ENCOUNTER — Other Ambulatory Visit (HOSPITAL_COMMUNITY): Payer: Self-pay | Admitting: Internal Medicine

## 2015-05-30 DIAGNOSIS — K8689 Other specified diseases of pancreas: Secondary | ICD-10-CM

## 2015-06-03 ENCOUNTER — Other Ambulatory Visit: Payer: Self-pay | Admitting: Radiology

## 2015-06-06 ENCOUNTER — Ambulatory Visit (HOSPITAL_COMMUNITY)
Admission: RE | Admit: 2015-06-06 | Discharge: 2015-06-06 | Disposition: A | Discharge status: Home / Self Care | Payer: 59 | Source: Ambulatory Visit | Attending: Internal Medicine | Admitting: Internal Medicine

## 2015-06-06 ENCOUNTER — Encounter (HOSPITAL_COMMUNITY): Payer: Self-pay

## 2015-06-06 DIAGNOSIS — R599 Enlarged lymph nodes, unspecified: Secondary | ICD-10-CM | POA: Diagnosis not present

## 2015-06-06 DIAGNOSIS — K769 Liver disease, unspecified: Secondary | ICD-10-CM | POA: Insufficient documentation

## 2015-06-06 DIAGNOSIS — R109 Unspecified abdominal pain: Secondary | ICD-10-CM | POA: Diagnosis not present

## 2015-06-06 DIAGNOSIS — K869 Disease of pancreas, unspecified: Secondary | ICD-10-CM | POA: Insufficient documentation

## 2015-06-06 DIAGNOSIS — I10 Essential (primary) hypertension: Secondary | ICD-10-CM | POA: Insufficient documentation

## 2015-06-06 DIAGNOSIS — E7800 Pure hypercholesterolemia, unspecified: Secondary | ICD-10-CM | POA: Insufficient documentation

## 2015-06-06 DIAGNOSIS — C250 Malignant neoplasm of head of pancreas: Secondary | ICD-10-CM | POA: Insufficient documentation

## 2015-06-06 DIAGNOSIS — R140 Abdominal distension (gaseous): Secondary | ICD-10-CM | POA: Diagnosis not present

## 2015-06-06 DIAGNOSIS — Z7984 Long term (current) use of oral hypoglycemic drugs: Secondary | ICD-10-CM | POA: Insufficient documentation

## 2015-06-06 DIAGNOSIS — K219 Gastro-esophageal reflux disease without esophagitis: Secondary | ICD-10-CM | POA: Diagnosis not present

## 2015-06-06 DIAGNOSIS — R918 Other nonspecific abnormal finding of lung field: Secondary | ICD-10-CM | POA: Insufficient documentation

## 2015-06-06 DIAGNOSIS — Z853 Personal history of malignant neoplasm of breast: Secondary | ICD-10-CM | POA: Insufficient documentation

## 2015-06-06 DIAGNOSIS — Z79899 Other long term (current) drug therapy: Secondary | ICD-10-CM | POA: Insufficient documentation

## 2015-06-06 DIAGNOSIS — G4730 Sleep apnea, unspecified: Secondary | ICD-10-CM | POA: Insufficient documentation

## 2015-06-06 DIAGNOSIS — C229 Malignant neoplasm of liver, not specified as primary or secondary: Secondary | ICD-10-CM | POA: Diagnosis not present

## 2015-06-06 DIAGNOSIS — E119 Type 2 diabetes mellitus without complications: Secondary | ICD-10-CM | POA: Diagnosis not present

## 2015-06-06 DIAGNOSIS — C801 Malignant (primary) neoplasm, unspecified: Secondary | ICD-10-CM

## 2015-06-06 DIAGNOSIS — K8689 Other specified diseases of pancreas: Secondary | ICD-10-CM

## 2015-06-06 HISTORY — DX: Malignant (primary) neoplasm, unspecified: C80.1

## 2015-06-06 LAB — GLUCOSE, CAPILLARY
GLUCOSE-CAPILLARY: 91 mg/dL (ref 65–99)
Glucose-Capillary: 106 mg/dL — ABNORMAL HIGH (ref 65–99)

## 2015-06-06 LAB — CBC
HEMATOCRIT: 31.3 % — AB (ref 36.0–46.0)
Hemoglobin: 10.6 g/dL — ABNORMAL LOW (ref 12.0–15.0)
MCH: 28.6 pg (ref 26.0–34.0)
MCHC: 33.9 g/dL (ref 30.0–36.0)
MCV: 84.6 fL (ref 78.0–100.0)
Platelets: 208 10*3/uL (ref 150–400)
RBC: 3.7 MIL/uL — ABNORMAL LOW (ref 3.87–5.11)
RDW: 13.9 % (ref 11.5–15.5)
WBC: 6.8 10*3/uL (ref 4.0–10.5)

## 2015-06-06 LAB — PROTIME-INR
INR: 0.98 (ref 0.00–1.49)
Prothrombin Time: 13.2 seconds (ref 11.6–15.2)

## 2015-06-06 LAB — APTT: aPTT: 30 seconds (ref 24–37)

## 2015-06-06 MED ORDER — GELATIN ABSORBABLE 12-7 MM EX MISC
CUTANEOUS | Status: AC
  Filled 2015-06-06: qty 1

## 2015-06-06 MED ORDER — HYDROCHLOROTHIAZIDE 12.5 MG PO CAPS
12.5000 mg | ORAL_CAPSULE | Freq: Once | ORAL | Status: AC
  Administered 2015-06-06: 12.5 mg via ORAL
  Filled 2015-06-06: qty 1

## 2015-06-06 MED ORDER — SODIUM CHLORIDE 0.9 % IV SOLN
INTRAVENOUS | Status: DC
  Administered 2015-06-06: 10:00:00 via INTRAVENOUS

## 2015-06-06 MED ORDER — IRBESARTAN 300 MG PO TABS
300.0000 mg | ORAL_TABLET | Freq: Once | ORAL | Status: AC
  Administered 2015-06-06: 300 mg via ORAL
  Filled 2015-06-06: qty 1

## 2015-06-06 MED ORDER — ACETAMINOPHEN-CODEINE #3 300-30 MG PO TABS
1.0000 | ORAL_TABLET | Freq: Once | ORAL | Status: AC
  Administered 2015-06-06: 1 via ORAL

## 2015-06-06 MED ORDER — MIDAZOLAM HCL 2 MG/2ML IJ SOLN
INTRAMUSCULAR | Status: AC
  Filled 2015-06-06: qty 2

## 2015-06-06 MED ORDER — MIDAZOLAM HCL 2 MG/2ML IJ SOLN
INTRAMUSCULAR | Status: AC | PRN
  Administered 2015-06-06: 1 mg via INTRAVENOUS

## 2015-06-06 MED ORDER — FENTANYL CITRATE (PF) 100 MCG/2ML IJ SOLN
INTRAMUSCULAR | Status: AC | PRN
  Administered 2015-06-06: 50 ug via INTRAVENOUS

## 2015-06-06 MED ORDER — FENTANYL CITRATE (PF) 100 MCG/2ML IJ SOLN
INTRAMUSCULAR | Status: AC
  Filled 2015-06-06: qty 2

## 2015-06-06 MED ORDER — LIDOCAINE HCL (PF) 1 % IJ SOLN
INTRAMUSCULAR | Status: AC
  Filled 2015-06-06: qty 10

## 2015-06-06 NOTE — Discharge Instructions (Signed)

## 2015-06-06 NOTE — H&P (Signed)
Chief Complaint: Patient was seen in consultation today for liver lesion biopsy at the request of Gates,Robert  Referring Physician(s): Gates,Robert  History of Present Illness: [name] is a 64 y.o. female   Hx Breast Ca New symptoms of abdominal pain and bloating Work up with imaging 04/2015 IMPRESSION: 1. Multifocal hepatic metastatic disease. The hepatic lesions are likely most amenable to percutaneous sampling. 2. Hypo enhancing mass involving the pancreatic tail with invasion of the splenic hilum, potentially a primary pancreatic adenocarcinoma or metastasis. 3. Multiple ill-defined pulmonary nodules are not typical of metastatic disease, but given the abdominal findings may reflect metastases. The dominant lingular lesion could reflect a primary lung cancer. 4. Nonspecific lymph nodes in the porta hepatis, probably nodal metastases. No thoracic adenopathy typical of breast cancer recurrence. 5. Indeterminate lesion in the proximal left femur, not suspected to reflect an active metastasis.  Now scheduled for liver lesion biopsy  Past Medical History  Diagnosis Date  . Aortic stenosis   . Hypertension   . Diabetes mellitus   . Hypercholesterolemia   . MVP (mitral valve prolapse)   . Hypertensive cardiovascular disease   . HX: breast cancer   . History of migraines   . Heart murmur   . Cancer (HCC)     breast  . Sleep apnea   . Headache(784.0)     hx migraines , none in past year  . History of kidney stones     passed  2  . GERD (gastroesophageal reflux disease)   . Arthritis     knee    Past Surgical History  Procedure Laterality Date  . Hip fracture surgery Left 1968  . Tubal ligation  1994  . Mastectomy Bilateral 2008    bilateral Dr.Streck  . Mastectomy Bilateral   . Knee arthroscopy Right 11/05/2013    Procedure: ARTHROSCOPY RIGHT KNEE WITH DEBRIDEMENT;  Surgeon: Marin Shutter, MD;  Location: [place];  Service: Orthopedics;   Laterality: Right;    Allergies: Other; Crestor; Iodine; Penicillins; and Pravachol  Medications: Prior to Admission medications   Medication Sig Start Date End Date Taking? Authorizing Provider  acetaminophen (TYLENOL) 500 MG tablet Take 1,000 mg by mouth every 6 (six) hours as needed for moderate pain.   Yes Historical Provider, MD  metFORMIN (GLUCOPHAGE XR) 500 MG 24 hr tablet Take 1 tablet (500 mg total) by mouth 2 (two) times daily. Patient taking differently: Take 500 mg by mouth 2 (two) times daily as needed (for diabetes).  12/31/13  Yes Darlin Coco, MD  olmesartan-hydrochlorothiazide (BENICAR HCT) 40-12.5 MG per tablet Take 1 tablet by mouth daily as needed (for blood pressure).    Yes Historical Provider, MD  pantoprazole (PROTONIX) 40 MG tablet Take 1 tablet (40 mg total) by mouth every other day. Every other day Patient taking differently: Take 40 mg by mouth daily as needed (for acid reflux).  12/31/13  Yes Darlin Coco, MD  amLODipine (NORVASC) 5 MG tablet Take 1 tablet (5 mg total) by mouth daily. Patient not taking: Reported on 06/02/2015 02/23/15   Darlin Coco, MD  chlorpheniramine-HYDROcodone (TUSSIONEX) 10-8 MG/5ML LQCR Take 5 mLs by mouth once. Patient not taking: Reported on 06/02/2015 07/31/14   Linton Flemings, MD  guaiFENesin (MUCINEX) 600 MG 12 hr tablet Take 1 tablet (600 mg total) by mouth 2 (two) times daily. Patient not taking: Reported on 06/02/2015 07/31/14   Linton Flemings, MD  ondansetron (ZOFRAN ODT) 8 MG disintegrating tablet Take 1 tablet (8  mg total) by mouth every 8 (eight) hours as needed for nausea. Patient not taking: Reported on 06/02/2015 02/23/15   Darlin Coco, MD  rosuvastatin (CRESTOR) 5 MG tablet Take 1 tablet (5 mg total) by mouth daily. Patient not taking: Reported on 06/02/2015 02/23/15   Darlin Coco, MD     Family History  Problem Relation Age of Onset  . Hypertension Mother   . Hypertension Father     Social History   Social  History  . Marital Status: Divorced    Spouse Name: N/A  . Number of Children: N/A  . Years of Education: N/A   Social History Main Topics  . Smoking status: Never Smoker   . Smokeless tobacco: None  . Alcohol Use: No  . Drug Use: No  . Sexual Activity: Not Asked   Other Topics Concern  . None   Social History Narrative    Review of Systems: A 12 point ROS discussed and pertinent positives are indicated in the HPI above.  All other systems are negative.  Review of Systems  Constitutional: Positive for appetite change and fatigue. Negative for activity change.  Respiratory: Negative for cough and shortness of breath.   Cardiovascular: Negative for chest pain.  Gastrointestinal: Positive for abdominal pain and abdominal distention.  Musculoskeletal: Negative for back pain.  Neurological: Positive for weakness.  Psychiatric/Behavioral: Negative for behavioral problems and confusion.    Vital Signs: BP 208/88 mmHg  Pulse 68  Temp(Src) 98 F (36.7 C)  Resp 18  Ht 5\' 6"  (1.676 m)  Wt 188 lb (85.276 kg)  BMI 30.36 kg/m2  SpO2 100%  Physical Exam  Constitutional: She is oriented to person, place, and time. She appears well-nourished.  Cardiovascular: Normal rate and regular rhythm.   Murmur heard. Pulmonary/Chest: Effort normal and breath sounds normal. She has no wheezes.  Abdominal: Soft. Bowel sounds are normal. There is tenderness.  Musculoskeletal: Normal range of motion.  Neurological: She is alert and oriented to person, place, and time.  Skin: Skin is warm and dry.  Psychiatric: She has a normal mood and affect. Her behavior is normal. Judgment and thought content normal.  Nursing note and vitals reviewed.   Mallampati Score:  MD Evaluation Airway: WNL Heart: WNL Abdomen: WNL Chest/ Lungs: WNL ASA  Classification: 3 Mallampati/Airway Score: One  Imaging: Ct Chest W Contrast  05/26/2015   CLINICAL DATA:  Low back and right upper quadrant abdominal  pain with bloating. History of bilateral breast cancer diagnosed in 2008 with mastectomy. Initial encounter.  EXAM: CT CHEST, ABDOMEN, AND PELVIS WITH CONTRAST  TECHNIQUE: Multidetector CT imaging of the chest, abdomen and pelvis was performed following the standard protocol during bolus administration of intravenous contrast.  CONTRAST:  173mL OMNIPAQUE IOHEXOL 300 MG/ML  SOLN  COMPARISON:  Abdominal ultrasound 05/23/2015.  No previous CTs.  FINDINGS: CT CHEST FINDINGS  Mediastinum/Nodes: There are no enlarged mediastinal, hilar or axillary lymph nodes. There is a 7 mm precarinal node on image 20. The thyroid gland, trachea and esophagus demonstrate no significant findings. The heart size is normal. There is no pericardial effusion. Coronary artery atherosclerosis and calcifications of the aortic valve are noted.  Lungs/Pleura: There is no pleural effusion.There is a dominant 1.7 x 1.4 cm slightly spiculated nodule in the lingula with probable postobstructive pneumonitis on image number 24. There are numerous other ill-defined nodules, including a 1.4 cm right upper lobe lesion on image 14 and a 9 mm right lower lobe lesion on image 37.  All of these are ill-defined and not typical of metastases.  Musculoskeletal/Chest wall: Postsurgical changes from bilateral mastectomy. No evidence of chest wall mass or suspicious osseous finding.  CT ABDOMEN AND PELVIS FINDINGS  Hepatobiliary: There are numerous ring-enhancing lesions throughout the liver consistent with metastatic disease. The largest lesions include a 1.7 cm lesion in the dome of the right hepatic lobe on image number 45 and a 3.0 cm lesion more inferiorly in the right hepatic lobe on image 55. All segments are involved. The gallbladder appears normal. There is minimal extrahepatic biliary prominence with distal duct tapering.  Pancreas: There is a 3.2 x 2.6 cm hypo enhancing mass involving the pancreatic tail on image number 53. This appears to invade the  splenic hilum. The pancreatic body and head appear normal. There is no pancreatic ductal dilatation or surrounding inflammatory change.  Spleen: As above, there is apparent mass involving the pancreatic tail which invades the splenic hilum. There is a probable associated peripheral perfusion defect anteriorly in the spleen. The spleen is normal in size.  Adrenals/Urinary Tract: Both adrenal glands appear normal. There are bilateral renal cysts, the largest in the interpolar region of the left kidney. No evidence of renal mass, hydronephrosis or urinary tract calculus. The bladder appears unremarkable.  Stomach/Bowel: No evidence of bowel wall thickening, distention or surrounding inflammatory change. The appendix appears normal.  Vascular/Lymphatic: There are mildly enlarged lymph nodes within the porta hepatis, including a 1.3 cm lesion on image 51 and a 1.4 cm portacaval node on image 55. There are small lymph nodes within the upper retroperitoneum. No significant vascular findings.  Reproductive: There is a probable small fibroid posteriorly in the uterine body. The uterus otherwise appears unremarkable. There is no adnexal mass.  Other: Postsurgical changes within the anterior abdominal wall.  Musculoskeletal: There is a mixed sclerotic and lytic lesion within the proximal left femur which is not typical for un treated metastatic disease. This could reflect treated metastatic disease, sequela of prior trauma or an incidental benign lesion. No lytic lesion or pathologic fracture demonstrated. There are facet degenerative changes within the lumbar spine.  IMPRESSION: 1. Multifocal hepatic metastatic disease. The hepatic lesions are likely most amenable to percutaneous sampling. 2. Hypo enhancing mass involving the pancreatic tail with invasion of the splenic hilum, potentially a primary pancreatic adenocarcinoma or metastasis. 3. Multiple ill-defined pulmonary nodules are not typical of metastatic disease, but  given the abdominal findings may reflect metastases. The dominant lingular lesion could reflect a primary lung cancer. 4. Nonspecific lymph nodes in the porta hepatis, probably nodal metastases. No thoracic adenopathy typical of breast cancer recurrence. 5. Indeterminate lesion in the proximal left femur, not suspected to reflect an active metastasis. 6. These results will be called to the ordering clinician or representative by the Radiologist Assistant, and communication documented in the PACS or zVision Dashboard.   Electronically Signed   By: Richardean Sale M.D.   On: 05/26/2015 14:13   US Abdomen Complete  05/23/2015   CLINICAL DATA:  Abdominal pain.  Remote history of breast cancer  EXAM: ULTRASOUND ABDOMEN COMPLETE  COMPARISON:  None.  FINDINGS: Gallbladder: No gallstones or wall thickening visualized. No sonographic Murphy sign noted.  Common bile duct: Diameter: Upper limits normal in size, 7-8 mm proximally tapering distally  Liver: Numerous (at least 10) hypoechoic lesions within the liver, the largest measures 5.9 x 4.2 x 3.4 cm. Findings most compatible with metastatic disease.  IVC: No abnormality visualized.  Pancreas: Visualized portion  unremarkable.  Spleen: Normal size. Central isoechoic to slightly hypoechoic mass in the spleen measures 3.6 x 3.3 x 3.2 cm. Cannot exclude metastatic focus.  Right Kidney: Length: 11.8 cm. Echogenicity within normal limits. No mass or hydronephrosis visualized.  Left Kidney: Length: 11.1 cm. Benign-appearing cysts, the largest in the lower pole measuring up to 4 cm. This contains a thin internal septation. Echogenicity within normal limits. No suspicious mass or hydronephrosis visualized.  Abdominal aorta: No aneurysm visualized.  Other findings: None.  IMPRESSION: Numerous hypoechoic lesions within the liver, the largest measuring up to 5.9 cm compatible with metastatic disease. 3.6 cm slightly hypoechoic lesion centrally within the spleen also could reflect  metastatic disease.  These results will be called to the ordering clinician or representative by the Radiologist Assistant, and communication documented in the PACS or zVision Dashboard.   Electronically Signed   By: Rolm Baptise M.D.   On: 05/23/2015 10:22   Ct Abdomen Pelvis W Contrast  05/26/2015   CLINICAL DATA:  Low back and right upper quadrant abdominal pain with bloating. History of bilateral breast cancer diagnosed in 2008 with mastectomy. Initial encounter.  EXAM: CT CHEST, ABDOMEN, AND PELVIS WITH CONTRAST  TECHNIQUE: Multidetector CT imaging of the chest, abdomen and pelvis was performed following the standard protocol during bolus administration of intravenous contrast.  CONTRAST:  114mL OMNIPAQUE IOHEXOL 300 MG/ML  SOLN  COMPARISON:  Abdominal ultrasound 05/23/2015.  No previous CTs.  FINDINGS: CT CHEST FINDINGS  Mediastinum/Nodes: There are no enlarged mediastinal, hilar or axillary lymph nodes. There is a 7 mm precarinal node on image 20. The thyroid gland, trachea and esophagus demonstrate no significant findings. The heart size is normal. There is no pericardial effusion. Coronary artery atherosclerosis and calcifications of the aortic valve are noted.  Lungs/Pleura: There is no pleural effusion.There is a dominant 1.7 x 1.4 cm slightly spiculated nodule in the lingula with probable postobstructive pneumonitis on image number 24. There are numerous other ill-defined nodules, including a 1.4 cm right upper lobe lesion on image 14 and a 9 mm right lower lobe lesion on image 37. All of these are ill-defined and not typical of metastases.  Musculoskeletal/Chest wall: Postsurgical changes from bilateral mastectomy. No evidence of chest wall mass or suspicious osseous finding.  CT ABDOMEN AND PELVIS FINDINGS  Hepatobiliary: There are numerous ring-enhancing lesions throughout the liver consistent with metastatic disease. The largest lesions include a 1.7 cm lesion in the dome of the right hepatic lobe  on image number 45 and a 3.0 cm lesion more inferiorly in the right hepatic lobe on image 55. All segments are involved. The gallbladder appears normal. There is minimal extrahepatic biliary prominence with distal duct tapering.  Pancreas: There is a 3.2 x 2.6 cm hypo enhancing mass involving the pancreatic tail on image number 53. This appears to invade the splenic hilum. The pancreatic body and head appear normal. There is no pancreatic ductal dilatation or surrounding inflammatory change.  Spleen: As above, there is apparent mass involving the pancreatic tail which invades the splenic hilum. There is a probable associated peripheral perfusion defect anteriorly in the spleen. The spleen is normal in size.  Adrenals/Urinary Tract: Both adrenal glands appear normal. There are bilateral renal cysts, the largest in the interpolar region of the left kidney. No evidence of renal mass, hydronephrosis or urinary tract calculus. The bladder appears unremarkable.  Stomach/Bowel: No evidence of bowel wall thickening, distention or surrounding inflammatory change. The appendix appears normal.  Vascular/Lymphatic: There  are mildly enlarged lymph nodes within the porta hepatis, including a 1.3 cm lesion on image 51 and a 1.4 cm portacaval node on image 55. There are small lymph nodes within the upper retroperitoneum. No significant vascular findings.  Reproductive: There is a probable small fibroid posteriorly in the uterine body. The uterus otherwise appears unremarkable. There is no adnexal mass.  Other: Postsurgical changes within the anterior abdominal wall.  Musculoskeletal: There is a mixed sclerotic and lytic lesion within the proximal left femur which is not typical for un treated metastatic disease. This could reflect treated metastatic disease, sequela of prior trauma or an incidental benign lesion. No lytic lesion or pathologic fracture demonstrated. There are facet degenerative changes within the lumbar spine.   IMPRESSION: 1. Multifocal hepatic metastatic disease. The hepatic lesions are likely most amenable to percutaneous sampling. 2. Hypo enhancing mass involving the pancreatic tail with invasion of the splenic hilum, potentially a primary pancreatic adenocarcinoma or metastasis. 3. Multiple ill-defined pulmonary nodules are not typical of metastatic disease, but given the abdominal findings may reflect metastases. The dominant lingular lesion could reflect a primary lung cancer. 4. Nonspecific lymph nodes in the porta hepatis, probably nodal metastases. No thoracic adenopathy typical of breast cancer recurrence. 5. Indeterminate lesion in the proximal left femur, not suspected to reflect an active metastasis. 6. These results will be called to the ordering clinician or representative by the Radiologist Assistant, and communication documented in the PACS or zVision Dashboard.   Electronically Signed   By: Richardean Sale M.D.   On: 05/26/2015 14:13    Labs:  CBC:  Recent Labs  01/18/15 1258 06/06/15 0931  WBC 6.2 6.8  HGB 10.7* 10.6*  HCT 31.9* 31.3*  PLT 200 208    COAGS: No results for input(s): INR, APTT in the last 8760 hours.  BMP:  Recent Labs  01/18/15 1258  NA 138  K 3.5  CL 103  CO2 23  GLUCOSE 105*  BUN 18  CALCIUM 9.5  CREATININE 1.18*  GFRNONAA 48*  GFRAA 55*    LIVER FUNCTION TESTS:  Recent Labs  01/18/15 1258  BILITOT 0.6  AST 23  ALT 27  ALKPHOS 125  PROT 7.6  ALBUMIN 4.1    TUMOR MARKERS: No results for input(s): AFPTM, CEA, CA199, CHROMGRNA in the last 8760 hours.  Assessment and Plan:  Hx breast Ca abd pain/bloating recently Imaging reveals Liver lesion; pancreatic mass; lung nodules and Lymphadenopathy Now scheduled for liver lesion biopsy Risks and Benefits discussed with the patient including, but not limited to bleeding, infection, damage to adjacent structures or low yield requiring additional tests. All of the patient's questions were  answered, patient is agreeable to proceed. Consent signed and in chart.   Thank you for this interesting consult.  I greatly enjoyed meeting [name] and look forward to participating in their care.  A copy of this report was sent to the requesting provider on this date.  Signed: Trysten Berti A 06/06/2015, 9:56 AM   I spent a total of  30 Minutes   in face to face in clinical consultation, greater than 50% of which was counseling/coordinating care for liver lesion bx

## 2015-06-06 NOTE — Procedures (Signed)
Liver Bx No comp No EBL

## 2015-08-01 LAB — HM DIABETES EYE EXAM

## 2015-08-11 ENCOUNTER — Telehealth: Payer: Self-pay | Admitting: Cardiology

## 2015-08-11 NOTE — Telephone Encounter (Signed)
Spoke with patient earlier today  She was at [facility] on 08/02/15 for cancer follow up and blood pressure was 170/100's  Per patient MD at [place] spoke with someone at this office and suggested increasing the Amlodipine to 10 mg daily Patient did so and has been taking her blood pressure at home with a wrist cuff which she did not feel was accurate.  She went to [facility] last night and check blood pressure. When she walked straight in and to machine it was elevated so she rested. Upon recheck blood pressure was down to 140/80's Patient said she would be out today to check her blood pressure  Advised patient to get an [employer] blood pressure arm cuff while she was out and to call with her blood pressure reading Blood pressure 127/62 on recheck Did not get machine but says she will get one tomorrow Advised to continue to monitor and call back if any further problems

## 2015-08-11 NOTE — Telephone Encounter (Signed)
New Message  Pt calling concerning her recent issues w/ her BP being consistently high- did not give readings. Please call back and discuss.

## 2015-08-12 NOTE — Telephone Encounter (Signed)
agree

## 2015-09-14 ENCOUNTER — Encounter: Payer: Self-pay | Admitting: Cardiology

## 2015-09-14 ENCOUNTER — Ambulatory Visit (INDEPENDENT_AMBULATORY_CARE_PROVIDER_SITE_OTHER): Payer: BLUE CROSS/BLUE SHIELD | Admitting: Cardiology

## 2015-09-14 VITALS — BP 96/60 | HR 72 | Ht 66.0 in | Wt 184.0 lb

## 2015-09-14 DIAGNOSIS — I119 Hypertensive heart disease without heart failure: Secondary | ICD-10-CM

## 2015-09-14 DIAGNOSIS — C259 Malignant neoplasm of pancreas, unspecified: Secondary | ICD-10-CM | POA: Diagnosis not present

## 2015-09-14 DIAGNOSIS — I359 Nonrheumatic aortic valve disorder, unspecified: Secondary | ICD-10-CM | POA: Diagnosis not present

## 2015-09-14 NOTE — Progress Notes (Signed)
Cardiology Office Note   Date:  09/14/2015   ID:  JAKLYNN JANOTA, DOB 1950/08/28, MRN [id number]  PCP:  Henrine Screws, MD  Cardiologist: Darlin Coco MD  No chief complaint on file.     History of Present Illness: [name] is a 65 y.o. 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 She has a history of aortic stenosis. Her last echocardiogram on 02/11/12 showed normal LV function with ejection fraction 55-65% and showed moderate but not severe aortic stenosis and there was grade 1 diastolic dysfunction.  The patient had a follow-up echocardiogram on 04/01/15 which showed an ejection fraction of 55-60% and showed no progression of her aortic stenosis.  Her peak gradient across the aortic valve was 49 with a mean gradient of 25, consistent with moderate but not severe aortic stenosis. The patient has been diagnosed with pancreatic and liver cancer.  She is being treated at [facility].  Recently she has been having weakness.  She has had anemia.  She required a recent blood transfusion for symptomatic anemia.  Her blood pressure has been running low and she has left off both her amlodipine and her Benicar HCT.  She has not been experiencing any chest pain but has been weak and short of breath and her legs feel tired.  Past Medical History  Diagnosis Date  . Aortic stenosis   . Hypertension   . Diabetes mellitus   . Hypercholesterolemia   . MVP (mitral valve prolapse)   . Hypertensive cardiovascular disease   . HX: breast cancer   . History of migraines   . Heart murmur   . Cancer (HCC)     breast  . Sleep apnea   . Headache(784.0)     hx migraines , none in past year  . History of kidney stones     passed  2  . GERD (gastroesophageal reflux disease)   . Arthritis     knee    Past Surgical History  Procedure Laterality Date  . Hip fracture  surgery Left 1968  . Tubal ligation  1994  . Mastectomy Bilateral 2008    bilateral Dr.Streck  . Mastectomy Bilateral   . Knee arthroscopy Right 11/05/2013    Procedure: ARTHROSCOPY RIGHT KNEE WITH DEBRIDEMENT;  Surgeon: Marin Shutter, MD;  Location: [place];  Service: Orthopedics;  Laterality: Right;     Current Outpatient Prescriptions  Medication Sig Dispense Refill  . acetaminophen (TYLENOL) 500 MG tablet Take 1,000 mg by mouth every 6 (six) hours as needed for moderate pain.    . diphenhydrAMINE (BENADRYL) 25 mg capsule Take 50 mg by mouth at bedtime as needed. itching    . lipase/protease/amylase (CREON) 36000 UNITS CPEP capsule Take 1 capsule by mouth daily. Take 1 capsule by mouth prior to snacks and take 2 capsules by mouth prior to meals    . magnesium oxide (MAG-OX) 400 MG tablet Take 1 tablet by mouth daily.    . ondansetron (ZOFRAN) 8 MG tablet Take 1 tablet by mouth 2 (two) times daily as needed. nausea    . oxyCODONE-acetaminophen (PERCOCET) 10-325 MG tablet Take 1-2 tablets by mouth every 4 (four) hours as needed. pain    . pantoprazole (PROTONIX) 40 MG tablet Take 40 mg by mouth daily as needed (indigestion).     No current facility-administered medications for this visit.    Allergies:   Other;  Crestor; Iodine; Latex; Penicillins; Pravachol; Pravastatin; and Rosuvastatin    Social History:  The patient  reports that she has never smoked. She does not have any smokeless tobacco history on file. She reports that she does not drink alcohol or use illicit drugs.   Family History:  The patient's family history includes Hypertension in her father and mother.    ROS:  Please see the history of present illness.   Otherwise, review of systems are positive for none.   All other systems are reviewed and negative.    PHYSICAL EXAM: VS:  BP 96/60 mmHg  Pulse 72  Ht 5\' 6"  (1.676 m)  Wt 184 lb (83.462 kg)  BMI 29.71 kg/m2 , BMI Body mass index is 29.71 kg/(m^2). GEN: Well  nourished, well developed, in no acute distress HEENT: normal Neck: no JVD, carotid bruits, or masses Cardiac: RRR; there is a harsh grade 3/6 systolic ejection murmur at the base radiating to the neck.  There is no peripheral edema. Respiratory:  clear to auscultation bilaterally, normal work of breathing GI: soft, nontender, nondistended, + BS MS: no deformity or atrophy Skin: warm and dry, no rash Neuro:  Strength and sensation are intact Psych: euthymic mood, full affect   EKG:  EKG is ordered today. The ekg ordered today demonstrates normal sinus rhythm with nonspecific T-wave abnormality.  No voltage for LVH.   Recent Labs: 01/18/2015: ALT 27; BUN 18; Creatinine, Ser 1.18*; Potassium 3.5; Sodium 138 06/06/2015: Hemoglobin 10.6*; Platelets 208    Lipid Panel    Component Value Date/Time   CHOL 276* 09/10/2011 1133   TRIG 116.0 09/10/2011 1133   HDL 60.60 09/10/2011 1133   CHOLHDL 5 09/10/2011 1133   VLDL 23.2 09/10/2011 1133   LDLDIRECT 193.1 09/10/2011 1133      Wt Readings from Last 3 Encounters:  09/14/15 184 lb (83.462 kg)  06/06/15 188 lb (85.276 kg)  02/23/15 197 lb 6.4 oz (89.54 kg)        ASSESSMENT AND PLAN:  1.  Moderate aortic stenosis.  Stable echocardiogram from 2013 and again in August 2016 2. diabetes mellitus followed by Dr. Michiel Sites 3. labile hypertension.  Blood pressure now running 4.  Pancreatic and liver cancer new since August 2016, being followed at [place]: Continue to hold blood pressure medicines since her blood pressure is running low.  She will get an [employer] pressure cuff to use at home.  She is a Marine scientist.   Current medicines are reviewed at length with the patient today.  The patient does not have concerns regarding medicines.  The following changes have been made:  no change  Labs/ tests ordered today include:   Orders Placed This Encounter  Procedures  . EKG 12-Lead    Disposition: She will return in 6 months for  a follow-up office visit with Dr. Oval Linsey.  She was reassured today that her aortic valve situation was stable.  Her exertional dyspnea is felt to be multifactorial but related in large part to her cancer and also to her symptomatic anemia.   Berna Spare MD 09/14/2015 6:43 PM    [address], [place],   [address] Phone: [phone]; Fax: [phone]

## 2015-09-14 NOTE — Patient Instructions (Signed)
Medication Instructions:  Your physician recommends that you continue on your current medications as directed. Please refer to the Current Medication list given to you today.  Labwork: NONE  Testing/Procedures: NONE  Follow-Up: Your physician wants you to follow-up in: [place] will receive a reminder letter in the mail two months in advance. If you don't receive a letter, please call our office to schedule the follow-up appointment.  Any Other Special Instructions Will Be Listed Below (If Applicable). GET AN OMRON BLOOD PRESSURE CUFF AND MONITOR AT HOME   If you need a refill on your cardiac medications before your next appointment, please call your pharmacy.

## 2015-09-22 ENCOUNTER — Telehealth: Payer: Self-pay | Admitting: Cardiology

## 2015-09-22 MED ORDER — METOPROLOL SUCCINATE ER 50 MG PO TB24: 50.0000 mg | ORAL_TABLET | Freq: Every day | ORAL | Status: DC

## 2015-09-22 NOTE — Telephone Encounter (Signed)
New message      Pt c/o BP issue: STAT if pt c/o blurred vision, one-sided weakness or slurred speech  1. What are your last 5 BP readings? 160/101  2. Are you having any other symptoms (ex. Dizziness, headache, blurred vision, passed out)? Weakness,   3. What is your BP issue? Pt has been off bp meds for 2 weeks because of low bp---bp is high again

## 2015-09-22 NOTE — Telephone Encounter (Signed)
Spoke with pt. She reports blood pressure of 160/101 yesterday. Today it is 155/95.  When she saw Dr. Mare Ferrari on 1/18 it was 96/60.  Pt states increase in BP started about 8 days ago.  First reading is always highest. She waits a few minutes and rechecks and second reading has usually been around 140/90. Diastolic never below 90 in last 8 days.  Has not had chemo in last 2 weeks due to blood pressure issues.  Heart rate 70-80.  Will forward to Dr. Mare Ferrari for review and recommendations

## 2015-09-22 NOTE — Telephone Encounter (Signed)
We will have her start taking Toprol XL 50 mg one daily

## 2015-09-22 NOTE — Telephone Encounter (Signed)
Spoke with pt and gave her instructions from Dr. Mare Ferrari.  She reports she is feeling less fatigued today. I asked her to monitor her blood pressure and let us know if Toprol does not help or if blood pressure becomes low again. Will send prescription to [facility].

## 2015-09-29 ENCOUNTER — Ambulatory Visit: Payer: 59 | Admitting: Cardiology

## 2015-10-19 ENCOUNTER — Encounter (HOSPITAL_COMMUNITY): Payer: Self-pay | Admitting: Emergency Medicine

## 2015-10-19 ENCOUNTER — Emergency Department (HOSPITAL_COMMUNITY)
Admission: EM | Admit: 2015-10-19 | Discharge: 2015-10-19 | Disposition: A | Discharge status: Home / Self Care | Payer: BLUE CROSS/BLUE SHIELD | Attending: Emergency Medicine | Admitting: Emergency Medicine

## 2015-10-19 ENCOUNTER — Emergency Department (HOSPITAL_COMMUNITY): Payer: BLUE CROSS/BLUE SHIELD

## 2015-10-19 DIAGNOSIS — J159 Unspecified bacterial pneumonia: Secondary | ICD-10-CM | POA: Diagnosis not present

## 2015-10-19 DIAGNOSIS — I341 Nonrheumatic mitral (valve) prolapse: Secondary | ICD-10-CM | POA: Insufficient documentation

## 2015-10-19 DIAGNOSIS — Z853 Personal history of malignant neoplasm of breast: Secondary | ICD-10-CM | POA: Insufficient documentation

## 2015-10-19 DIAGNOSIS — Z880 Allergy status to penicillin: Secondary | ICD-10-CM | POA: Insufficient documentation

## 2015-10-19 DIAGNOSIS — R079 Chest pain, unspecified: Secondary | ICD-10-CM | POA: Diagnosis present

## 2015-10-19 DIAGNOSIS — R011 Cardiac murmur, unspecified: Secondary | ICD-10-CM | POA: Insufficient documentation

## 2015-10-19 DIAGNOSIS — K219 Gastro-esophageal reflux disease without esophagitis: Secondary | ICD-10-CM | POA: Insufficient documentation

## 2015-10-19 DIAGNOSIS — J189 Pneumonia, unspecified organism: Secondary | ICD-10-CM

## 2015-10-19 DIAGNOSIS — Z87442 Personal history of urinary calculi: Secondary | ICD-10-CM | POA: Diagnosis not present

## 2015-10-19 DIAGNOSIS — M179 Osteoarthritis of knee, unspecified: Secondary | ICD-10-CM | POA: Insufficient documentation

## 2015-10-19 DIAGNOSIS — E119 Type 2 diabetes mellitus without complications: Secondary | ICD-10-CM | POA: Insufficient documentation

## 2015-10-19 DIAGNOSIS — Z91040 Latex allergy status: Secondary | ICD-10-CM | POA: Insufficient documentation

## 2015-10-19 DIAGNOSIS — I10 Essential (primary) hypertension: Secondary | ICD-10-CM | POA: Diagnosis not present

## 2015-10-19 DIAGNOSIS — G43909 Migraine, unspecified, not intractable, without status migrainosus: Secondary | ICD-10-CM | POA: Insufficient documentation

## 2015-10-19 LAB — I-STAT TROPONIN, ED
TROPONIN I, POC: 0.01 ng/mL (ref 0.00–0.08)
Troponin i, poc: 0.01 ng/mL (ref 0.00–0.08)

## 2015-10-19 LAB — BASIC METABOLIC PANEL
ANION GAP: 10 (ref 5–15)
BUN: 9 mg/dL (ref 6–20)
CALCIUM: 8.8 mg/dL — AB (ref 8.9–10.3)
CO2: 24 mmol/L (ref 22–32)
CREATININE: 1.09 mg/dL — AB (ref 0.44–1.00)
Chloride: 106 mmol/L (ref 101–111)
GFR calc Af Amer: >60 mL/min (ref >60)
GFR calc non Af Amer: 52 mL/min — ABNORMAL LOW (ref >60)
GLUCOSE: 172 mg/dL — AB (ref 65–99)
Potassium: 3.9 mmol/L (ref 3.5–5.1)
Sodium: 140 mmol/L (ref 135–145)

## 2015-10-19 LAB — CBC
HCT: 24.8 % — ABNORMAL LOW (ref 36.0–46.0)
HEMOGLOBIN: 8.4 g/dL — AB (ref 12.0–15.0)
MCH: 31 pg (ref 26.0–34.0)
MCHC: 33.9 g/dL (ref 30.0–36.0)
MCV: 91.5 fL (ref 78.0–100.0)
Platelets: 170 10*3/uL (ref 150–400)
RBC: 2.71 MIL/uL — ABNORMAL LOW (ref 3.87–5.11)
RDW: 19.1 % — AB (ref 11.5–15.5)
WBC: 7.1 10*3/uL (ref 4.0–10.5)

## 2015-10-19 MED ORDER — HYDRALAZINE HCL 10 MG PO TABS
10.0000 mg | ORAL_TABLET | Freq: Once | ORAL | Status: AC
  Administered 2015-10-19: 10 mg via ORAL
  Filled 2015-10-19: qty 1

## 2015-10-19 MED ORDER — HYDROCHLOROTHIAZIDE 25 MG PO TABS: 25.0000 mg | ORAL_TABLET | Freq: Every day | ORAL | Status: DC

## 2015-10-19 MED ORDER — ACETAMINOPHEN 325 MG PO TABS
650.0000 mg | ORAL_TABLET | Freq: Once | ORAL | Status: AC
  Administered 2015-10-19: 650 mg via ORAL
  Filled 2015-10-19: qty 2

## 2015-10-19 MED ORDER — AZITHROMYCIN 250 MG PO TABS: 250.0000 mg | ORAL_TABLET | Freq: Every day | ORAL | Status: DC

## 2015-10-19 NOTE — ED Notes (Signed)
Pt is in stable condition upon d/c and ambulates from ED. 

## 2015-10-19 NOTE — ED Notes (Signed)
Pt from home for eval of intermittent substernal cp with radiation to right arm ongoing for a few days, pt states hx of aortic stenosis. Pt reports some sob but denies any other symptoms. Pt hypertensive in triage.

## 2015-10-19 NOTE — ED Provider Notes (Signed)
CSN: [id number]     Arrival date & time 10/19/15  1142 History   First MD Initiated Contact with Patient 10/19/15 1343     Chief Complaint  Patient presents with  . Chest Pain     (Consider location/radiation/quality/duration/timing/severity/associated sxs/prior Treatment) HPI Comments: 3 weeks ago, was coughing up mucous, was improving Took mucinex Cough has improved significantly, no more nasal congestion, clear sputum, decreased significantly   Patient is a 65 y.o. female presenting with chest pain.  Chest Pain Pain location:  Substernal area Pain quality: dull   Pain radiates to:  L arm Pain radiates to the back: no   Pain severity:  Moderate Onset quality:  Gradual Duration:  3 days Timing:  Constant Exacerbated by: laying down at night, spontaneous pain, sometimes worse with exertion, however other times able to exert self without pain, sometimes present at rest, seems to be worse at night. Associated symptoms: cough, fatigue, headache (started slowly and worsened), palpitations and shortness of breath   Associated symptoms: no abdominal pain, no back pain, no diaphoresis (sweat at night last night, no sweating with pain), no fever, no lower extremity edema (1st mtp right side with some swelling, recent gout, no other leg swelling), no nausea, no numbness, no syncope, not vomiting and no weakness     Past Medical History  Diagnosis Date  . Aortic stenosis   . Hypertension   . Diabetes mellitus   . Hypercholesterolemia   . MVP (mitral valve prolapse)   . Hypertensive cardiovascular disease   . HX: breast cancer   . History of migraines   . Heart murmur   . Cancer (HCC)     breast  . Sleep apnea   . Headache(784.0)     hx migraines , none in past year  . History of kidney stones     passed  2  . GERD (gastroesophageal reflux disease)   . Arthritis     knee   Past Surgical History  Procedure Laterality Date  . Hip fracture surgery Left 1968  . Tubal ligation   1994  . Mastectomy Bilateral 2008    bilateral Dr.Streck  . Mastectomy Bilateral   . Knee arthroscopy Right 11/05/2013    Procedure: ARTHROSCOPY RIGHT KNEE WITH DEBRIDEMENT;  Surgeon: Marin Shutter, MD;  Location: [place];  Service: Orthopedics;  Laterality: Right;   Family History  Problem Relation Age of Onset  . Hypertension Mother   . Hypertension Father    Social History  Substance Use Topics  . Smoking status: Never Smoker   . Smokeless tobacco: None  . Alcohol Use: No   OB History    No data available     Review of Systems  Constitutional: Positive for fatigue. Negative for fever and diaphoresis (sweat at night last night, no sweating with pain).  HENT: Positive for congestion. Negative for sore throat.   Eyes: Negative for visual disturbance.  Respiratory: Positive for cough and shortness of breath.   Cardiovascular: Positive for chest pain and palpitations. Negative for leg swelling and syncope.  Gastrointestinal: Negative for nausea, vomiting and abdominal pain.  Genitourinary: Negative for difficulty urinating.  Musculoskeletal: Negative for back pain and neck pain.  Skin: Negative for rash.  Neurological: Positive for headaches (started slowly and worsened). Negative for syncope, weakness and numbness.      Allergies  Other; Crestor; Iodine; Latex; Penicillins; Pravachol; Pravastatin; and Rosuvastatin  Home Medications   Prior to Admission medications   Medication Sig Start Date  End Date Taking? Authorizing Provider  acetaminophen (TYLENOL) 500 MG tablet Take 1,000 mg by mouth every 6 (six) hours as needed for moderate pain.   Yes Historical Provider, MD  diphenhydrAMINE (BENADRYL) 25 mg capsule Take 50 mg by mouth at bedtime as needed. itching   Yes Historical Provider, MD  lipase/protease/amylase (CREON) 36000 UNITS CPEP capsule Take 1 capsule by mouth daily. Take 1 capsule by mouth prior to snacks and take 2 capsules by mouth prior to meals 08/23/15  Yes  Historical Provider, MD  magnesium oxide (MAG-OX) 400 MG tablet Take 1 tablet by mouth daily. 08/30/15 08/29/16 Yes Historical Provider, MD  metoprolol succinate (TOPROL-XL) 50 MG 24 hr tablet Take 1 tablet (50 mg total) by mouth daily. Take with or immediately following a meal. 09/22/15  Yes Darlin Coco, MD  ondansetron (ZOFRAN) 8 MG tablet Take 1 tablet by mouth 2 (two) times daily as needed. nausea 06/28/15  Yes Historical Provider, MD  oxyCODONE-acetaminophen (PERCOCET) 10-325 MG tablet Take 1-2 tablets by mouth every 4 (four) hours as needed. pain 05/20/15  Yes Historical Provider, MD  pantoprazole (PROTONIX) 40 MG tablet Take 40 mg by mouth daily as needed (indigestion).   Yes Historical Provider, MD  azithromycin (ZITHROMAX) 250 MG tablet Take 1 tablet (250 mg total) by mouth daily. Take first 2 tablets together, then 1 every day until finished. 10/19/15   Gareth Morgan, MD  hydrochlorothiazide (HYDRODIURIL) 25 MG tablet Take 1 tablet (25 mg total) by mouth daily. 10/19/15   Gareth Morgan, MD   BP 159/81 mmHg  Pulse 55  Temp(Src) 98.5 F (36.9 C) (Oral)  Resp 18  Ht 5\' 6"  (1.676 m)  Wt 182 lb (82.555 kg)  BMI 29.39 kg/m2  SpO2 100% Physical Exam  Constitutional: She is oriented to person, place, and time. She appears well-developed and well-nourished. No distress.  HENT:  Head: Normocephalic and atraumatic.  Eyes: Conjunctivae and EOM are normal.  Neck: Normal range of motion.  Cardiovascular: Normal rate, regular rhythm, normal heart sounds and intact distal pulses.  Exam reveals no gallop and no friction rub.   No murmur heard. Pulmonary/Chest: Effort normal and breath sounds normal. No respiratory distress. She has no wheezes. She has no rales. She exhibits tenderness.  Abdominal: Soft. She exhibits no distension. There is no tenderness. There is no guarding.  Musculoskeletal: She exhibits no edema or tenderness.  Neurological: She is alert and oriented to person, place, and  time.  Skin: Skin is warm and dry. No rash noted. She is not diaphoretic. No erythema.  Nursing note and vitals reviewed.   ED Course  Procedures (including critical care time) Labs Review Labs Reviewed  BASIC METABOLIC PANEL - Abnormal; Notable for the following:    Glucose, Bld 172 (*)    Creatinine, Ser 1.09 (*)    Calcium 8.8 (*)    GFR calc non Af Amer 52 (*)    All other components within normal limits  CBC - Abnormal; Notable for the following:    RBC 2.71 (*)    Hemoglobin 8.4 (*)    HCT 24.8 (*)    RDW 19.1 (*)    All other components within normal limits  I-STAT TROPOININ, ED  Randolm Idol, ED    Imaging Review Dg Chest 2 View  10/19/2015  CLINICAL DATA:  Chest pain and cough, several day duration. EXAM: CHEST  2 VIEW COMPARISON:  01/18/2015 FINDINGS: Power port in place on the right with the catheter tip in the  SVC above the right atrium. Right lung is clear. There is newly seen atelectasis or scarring in the perihilar region on the left. There is patchy density in the left lower lobe consistent with atelectasis or pneumonia. No effusions. Ordinary degenerative changes affect the spine. IMPRESSION: Newly seen left perihilar and lower lobe atelectasis and/or mild pneumonia. No lobar collapse. Electronically Signed   By: Nelson Chimes M.D.   On: 10/19/2015 12:26   I have personally reviewed and evaluated these images and lab results as part of my medical decision-making.   EKG Interpretation   Date/Time:  Wednesday October 19 2015 11:50:57 EST Ventricular Rate:  59 PR Interval:  132 QRS Duration: 72 QT Interval:  486 QTC Calculation: 481 R Axis:   -24 Text Interpretation:  Sinus bradycardia Nonspecific T wave abnormality  Prolonged QT Abnormal ECG No significant change since last tracing  Confirmed by [facility] MD, [place] ([id number]) on 10/19/2015 2:46:37 PM      MDM   Final diagnoses:  Chest pain, unspecified chest pain type  Community acquired pneumonia   Essential hypertension   65yo female with a history of aortic stenosis, hypertension, DM, hypercholesterolemia, MVP, breast cancer s/p bilateral mastectomy 2008, pancreatic cancer with metastases to the liver under treatment at [facility] presents with concern for chest pain.  Patient has had SOB beginning last month, with cough beginning 3 weeks ago, with CP beginning over the last 3 days. Patient saw Cardiologist during time of SOB and in addition she reports having a negative PE study.  Pt has no hypoxia, no tachypnea, no tachycardia, no asymmetric leg swelling, no hemoptysis with recent negative study for PE per pt for this SOB.   EKG with sinus bradycardia, otherwise similar ECG to prior.  Troponin negative x 2.  CP happens spontaneously, and is not classically worse with exertion, and she has no associated nausea or diaphoresis.  CXR shows pneumonia, which although cough is decreasing, may indicate etiology of patient's chest pain.  Fatigue may also be secondary to anemia, which is not significantly worse from recent visit at [facility] and do not feel transfusion is appropriate at this time. Pt with hypertension, however negative troponin, no current CP, and doubt hypertensive emergency.  Given dose of hydralazine with decrease in BP to [id number] systolic. Discussed thought process with patient in detail who agrees with plan for antibiotics for pneumonia, new blood pressure medicine (initiated hydrochlorothiazide 25mg  daily), and follow up with Cardiologist within one week as well as return if symptoms return or worsen.  Patient states understanding. Patient discharged in stable condition with understanding of reasons to return.    Gareth Morgan, MD 10/19/15 2216

## 2015-10-19 NOTE — ED Notes (Signed)
Pt requesting we wait until MD sees pt IV for placement. Does not want at this time.

## 2016-04-08 IMAGING — DX DG CHEST 2V
2 series · 2 of 2 positions shown · non-contrast
Comparison: 01/18/2015

CLINICAL DATA: Chest pain and cough, several day duration.

EXAM:
CHEST  2 VIEW

[w chest pa]
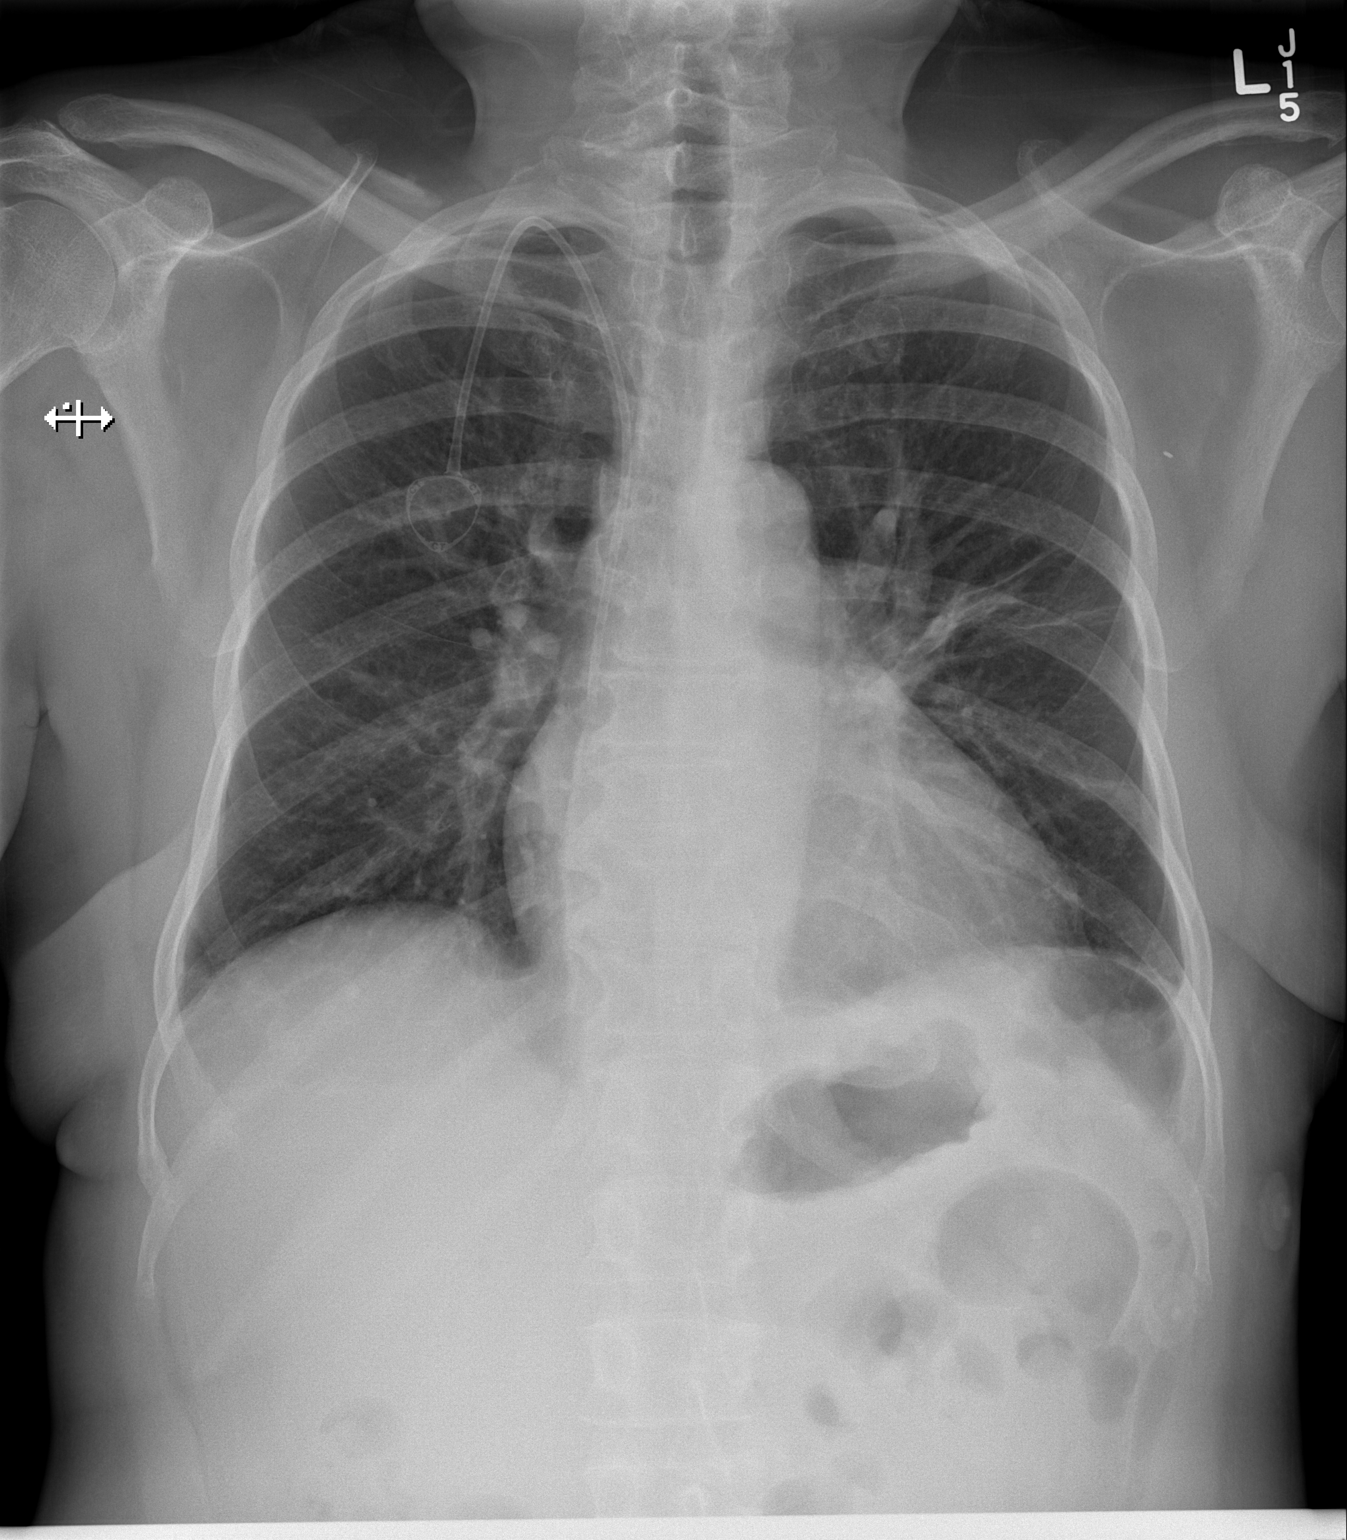

[w chest lat]
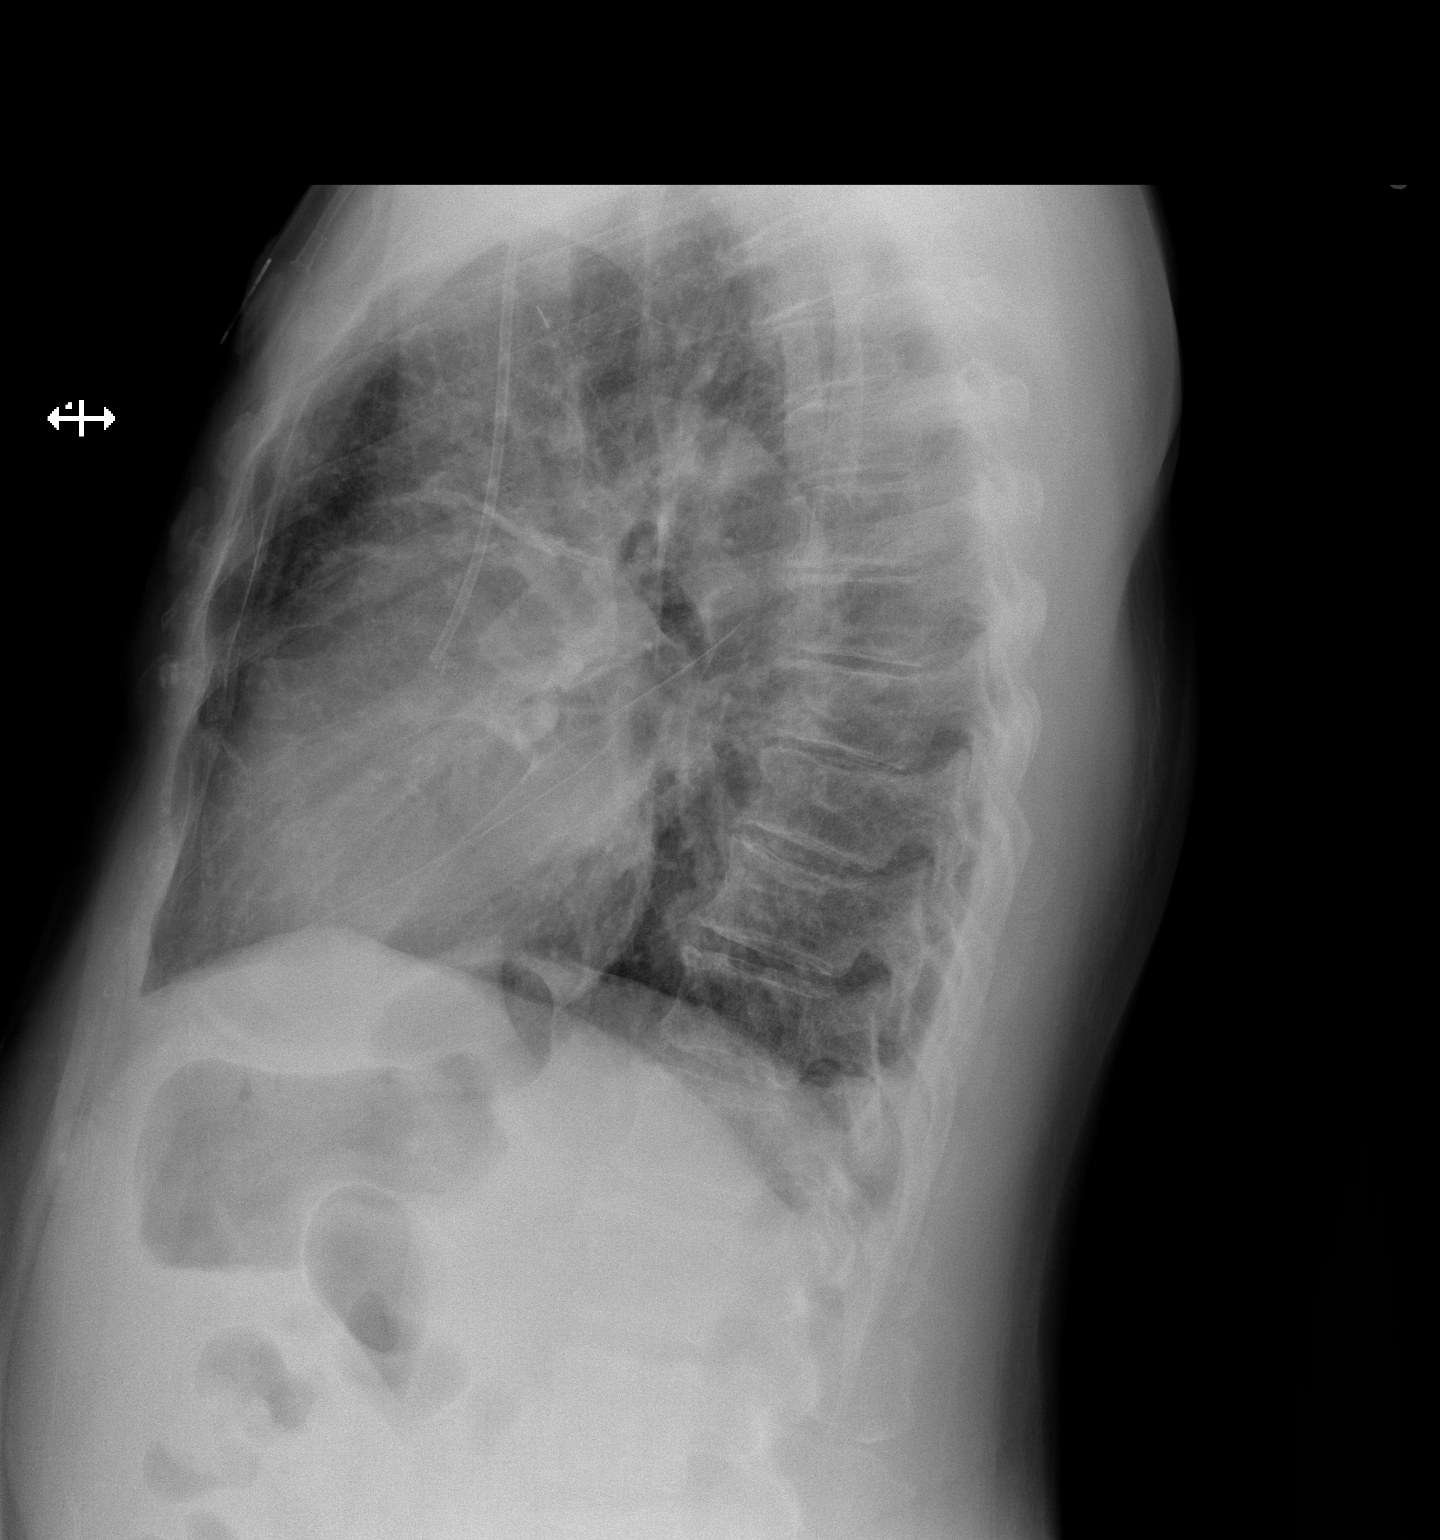

[2 of 2 positions shown; findings below may reference images not displayed]

FINDINGS: Power port in place on the right with the catheter tip in the SVC
above the right atrium. Right lung is clear. There is newly seen
atelectasis or scarring in the perihilar region on the left. There
is patchy density in the left lower lobe consistent with atelectasis
or pneumonia. No effusions. Ordinary degenerative changes affect the
spine.
IMPRESSION: Newly seen left perihilar and lower lobe atelectasis and/or mild
pneumonia. No lobar collapse.

## 2016-05-28 ENCOUNTER — Emergency Department (HOSPITAL_COMMUNITY)
Admission: EM | Admit: 2016-05-28 | Discharge: 2016-05-28 | Disposition: A | Discharge status: Home / Self Care | Payer: Medicare Other | Attending: Emergency Medicine | Admitting: Emergency Medicine

## 2016-05-28 ENCOUNTER — Encounter (HOSPITAL_COMMUNITY): Payer: Self-pay | Admitting: Emergency Medicine

## 2016-05-28 ENCOUNTER — Emergency Department (HOSPITAL_COMMUNITY): Payer: Medicare Other

## 2016-05-28 DIAGNOSIS — Y939 Activity, unspecified: Secondary | ICD-10-CM | POA: Insufficient documentation

## 2016-05-28 DIAGNOSIS — E119 Type 2 diabetes mellitus without complications: Secondary | ICD-10-CM | POA: Insufficient documentation

## 2016-05-28 DIAGNOSIS — S0990XA Unspecified injury of head, initial encounter: Secondary | ICD-10-CM | POA: Insufficient documentation

## 2016-05-28 DIAGNOSIS — Z91040 Latex allergy status: Secondary | ICD-10-CM | POA: Insufficient documentation

## 2016-05-28 DIAGNOSIS — I119 Hypertensive heart disease without heart failure: Secondary | ICD-10-CM | POA: Insufficient documentation

## 2016-05-28 DIAGNOSIS — Y999 Unspecified external cause status: Secondary | ICD-10-CM | POA: Diagnosis not present

## 2016-05-28 DIAGNOSIS — Z853 Personal history of malignant neoplasm of breast: Secondary | ICD-10-CM | POA: Diagnosis not present

## 2016-05-28 DIAGNOSIS — W228XXA Striking against or struck by other objects, initial encounter: Secondary | ICD-10-CM | POA: Insufficient documentation

## 2016-05-28 DIAGNOSIS — Y92512 Supermarket, store or market as the place of occurrence of the external cause: Secondary | ICD-10-CM | POA: Insufficient documentation

## 2016-05-28 DIAGNOSIS — I10 Essential (primary) hypertension: Secondary | ICD-10-CM | POA: Diagnosis not present

## 2016-05-28 DIAGNOSIS — Z79899 Other long term (current) drug therapy: Secondary | ICD-10-CM | POA: Insufficient documentation

## 2016-05-28 MED ORDER — TRAMADOL HCL 50 MG PO TABS: 50.0000 mg | ORAL_TABLET | Freq: Four times a day (QID) | ORAL | 0 refills | Status: DC | PRN

## 2016-05-28 MED ORDER — KETOROLAC TROMETHAMINE 60 MG/2ML IM SOLN
30.0000 mg | Freq: Once | INTRAMUSCULAR | Status: AC
  Administered 2016-05-28: 30 mg via INTRAMUSCULAR
  Filled 2016-05-28: qty 2

## 2016-05-28 MED ORDER — IBUPROFEN 800 MG PO TABS: 800.0000 mg | ORAL_TABLET | Freq: Three times a day (TID) | ORAL | 0 refills | Status: DC | PRN

## 2016-05-28 NOTE — ED Notes (Signed)
Patient transported to CT 

## 2016-05-28 NOTE — Discharge Instructions (Signed)
Return here as needed.  Follow-up with your primary care doctor.  Use ice over the area that is sore.  CT scan did not show any abnormality

## 2016-05-28 NOTE — ED Triage Notes (Signed)
Patient states that she was at grocery store on Saturday when she was bent over getting something, after standing turned and hit right side of head on scale. Patient denies LOC at this time. But is having headache, blurred vision. Patient went to Urgent care and was sent here for further evaluation. Patient takes baby ASA every 3-4 days.

## 2016-05-30 NOTE — ED Provider Notes (Signed)
[facility] DEPT Provider Note   CSN: [id number] Arrival date & time: 05/28/16  1326     History   Chief Complaint Chief Complaint  Patient presents with  . Head Injury  . Blurred Vision    HPI [name] is a 65 y.o. female.  HPI Patient presents to the emergency department with a head injury that occurred on Saturday.  The patient states she was at the grocery store when she bent over to pick something up and she raised up and hit the right side of her temple on a scale in the produce section.  Patient states that she has had pain in that area since that time she noted some swelling to the area.  She also states that she does have increased pain with palpation.  Patient states she took Tylenol without relief of her symptoms, states nothing seems to make the condition better, states lying back increases the pressure around the area. The patient denies chest pain, shortness of breath, headache,blurred vision, neck pain, fever, cough, weakness, numbness, dizziness, anorexia, edema, abdominal pain, nausea, vomiting, diarrhea, rash, back pain, dysuria, hematemesis, bloody stool, near syncope, or syncope. Past Medical History:  Diagnosis Date  . Aortic stenosis   . Arthritis    knee  . Cancer (HCC)    breast  . Diabetes mellitus   . GERD (gastroesophageal reflux disease)   . Headache(784.0)    hx migraines , none in past year  . Heart murmur   . History of kidney stones    passed  2  . History of migraines   . HX: breast cancer   . Hypercholesterolemia   . Hypertension   . Hypertensive cardiovascular disease   . MVP (mitral valve prolapse)   . Sleep apnea     Patient Active Problem List   Diagnosis Date Noted  . Pancreatic mass   . Liver lesion   . Right knee pain 08/18/2013  . Patellar tendinitis 08/18/2013  . Tear of medial meniscus of right knee 08/18/2013  . Left shoulder pain 05/15/2012  . Dyspepsia 02/04/2012  . External hemorrhoid 10/15/2011    . Aortic stenosis   . Hypercholesterolemia   . MVP (mitral valve prolapse)   . Hypertensive cardiovascular disease   . HX: breast cancer   . History of migraines   . Disorders of bursae and tendons in shoulder region, unspecified 09/20/2008  . DIABETES MELLITUS, TYPE II 07/10/2007  . HYPERTENSION 07/10/2007  . AORTIC STENOSIS 07/10/2007    Past Surgical History:  Procedure Laterality Date  . HIP FRACTURE SURGERY Left 1968  . KNEE ARTHROSCOPY Right 11/05/2013   Procedure: ARTHROSCOPY RIGHT KNEE WITH DEBRIDEMENT;  Surgeon: Marin Shutter, MD;  Location: [facility];  Service: Orthopedics;  Laterality: Right;  . mastectomy Bilateral 2008   bilateral Dr.Streck  . MASTECTOMY Bilateral   . TUBAL LIGATION  1994    OB History    No data available       Home Medications    Prior to Admission medications   Medication Sig Start Date End Date Taking? Authorizing Provider  acetaminophen (TYLENOL) 500 MG tablet Take 1,000 mg by mouth every 6 (six) hours as needed for moderate pain.   Yes Historical Provider, MD  allopurinol (ZYLOPRIM) 300 MG tablet Take 150 mg by mouth daily as needed. gout 03/26/16  Yes Historical Provider, MD  ALPRAZolam (XANAX) 0.25 MG tablet Take 0.25-50 mg by mouth at bedtime. 05/04/16  Yes Historical Provider, MD  amLODipine ([place])  5 MG tablet Take 5 mg by mouth daily. 05/09/16  Yes Historical Provider, MD  calcium citrate-vitamin D (CITRACAL+D) 315-200 MG-UNIT tablet Take 1 tablet by mouth daily.   Yes Historical Provider, MD  diphenhydrAMINE (BENADRYL) 25 mg capsule Take 50 mg by mouth at bedtime as needed. itching   Yes Historical Provider, MD  hydrochlorothiazide (HYDRODIURIL) 25 MG tablet Take 1 tablet (25 mg total) by mouth daily. 10/19/15  Yes Gareth Morgan, MD  lipase/protease/amylase (CREON) 36000 UNITS CPEP capsule Take 72,000 capsules by mouth 2 (two) times daily at 8 am and 10 pm. Take 2 capsules before breakfast and dinner 08/23/15  Yes Historical Provider, MD   magnesium oxide (MAG-OX) 400 MG tablet Take 1 tablet by mouth daily. 08/30/15 08/29/16 Yes Historical Provider, MD  metoprolol succinate (TOPROL-XL) 50 MG 24 hr tablet Take 1 tablet (50 mg total) by mouth daily. Take with or immediately following a meal. Patient taking differently: Take 25 mg by mouth daily.  09/22/15  Yes Darlin Coco, MD  naproxen (EC NAPROSYN) 500 MG EC tablet Take 500 mg by mouth 2 (two) times daily as needed for pain. For gout 02/21/16 02/20/17 Yes Historical Provider, MD  ondansetron (ZOFRAN) 8 MG tablet Take 1 tablet by mouth 2 (two) times daily as needed. nausea 06/28/15  Yes Historical Provider, MD  pantoprazole (PROTONIX) 40 MG tablet Take 40 mg by mouth daily as needed (indigestion).   Yes Historical Provider, MD  ibuprofen (ADVIL,MOTRIN) 800 MG tablet Take 1 tablet (800 mg total) by mouth every 8 (eight) hours as needed. 05/28/16   Dalia Heading, PA-C  traMADol (ULTRAM) 50 MG tablet Take 1 tablet (50 mg total) by mouth every 6 (six) hours as needed for severe pain. 05/28/16   Dalia Heading, PA-C    Family History Family History  Problem Relation Age of Onset  . Hypertension Mother   . Hypertension Father     Social History Social History  Substance Use Topics  . Smoking status: Never Smoker  . Smokeless tobacco: Never Used  . Alcohol use No     Allergies   Shellfish allergy; Prednisone; Iodine; Latex; Penicillins; Pravachol; Pravastatin; and Rosuvastatin   Review of Systems Review of Systems  All other systems negative except as documented in the HPI. All pertinent positives and negatives as reviewed in the HPI.= Physical Exam Updated Vital Signs BP 150/85   Pulse (!) 55   Temp 98.3 F (36.8 C) (Oral)   Resp 18   Ht 5\' 6"  (1.676 m)   Wt 86.2 kg   SpO2 100%   BMI 30.67 kg/m   Physical Exam  Constitutional: She is oriented to person, place, and time. She appears well-developed and well-nourished. No distress.  HENT:  Head:  Normocephalic.    Mouth/Throat: Oropharynx is clear and moist.  Eyes: Pupils are equal, round, and reactive to light.  Neck: Normal range of motion. Neck supple.  Cardiovascular: Normal rate, regular rhythm and normal heart sounds.  Exam reveals no gallop and no friction rub.   No murmur heard. Pulmonary/Chest: Effort normal and breath sounds normal. No respiratory distress. She has no wheezes.  Abdominal: Soft. Bowel sounds are normal. She exhibits no distension. There is no tenderness.  Neurological: She is alert and oriented to person, place, and time. She has normal strength. No sensory deficit. She exhibits normal muscle tone. Coordination and gait normal. GCS eye subscore is 4. GCS verbal subscore is 5. GCS motor subscore is 6.  Skin: Skin is warm  and dry. No rash noted. No erythema.  Psychiatric: She has a normal mood and affect. Her behavior is normal.  Nursing note and vitals reviewed.    ED Treatments / Results  Labs (all labs ordered are listed, but only abnormal results are displayed) Labs Reviewed - No data to display  EKG  EKG Interpretation None       Radiology Ct Head Wo Contrast  Result Date: 05/28/2016 CLINICAL DATA:  Struck RIGHT side of head on a scale when standing up after being bent over, no loss of consciousness, having headaches and blurred vision, history hypertension, diabetes mellitus, breast cancer, on baby aspirin EXAM: CT HEAD WITHOUT CONTRAST TECHNIQUE: Contiguous axial images were obtained from the base of the skull through the vertex without intravenous contrast. COMPARISON:  None FINDINGS: Brain: Normal ventricular morphology. No midline shift or mass effect. Normal appearance of brain parenchyma. No intracranial hemorrhage, mass lesion, or evidence of acute infarction. No extra-axial fluid collections. Vascular: Minimal atherosclerotic calcification at the carotid siphons Skull: Intact Sinuses/Orbits: Clear Other: N/A IMPRESSION: No acute  intracranial abnormalities. Electronically Signed   By: Lavonia Dana M.D.   On: 05/28/2016 17:04    Procedures Procedures (including critical care time)  Medications Ordered in ED Medications  ketorolac (TORADOL) injection 30 mg (30 mg Intramuscular Given 05/28/16 1720)     Initial Impression / Assessment and Plan / ED Course  I have reviewed the triage vital signs and the nursing notes.  Pertinent labs & imaging results that were available during my care of the patient were reviewed by me and considered in my medical decision making (see chart for details).  Clinical Course    Patient has a negative CT scan will be referred back to her primary care Dr. told to return here as needed.  Patient agrees the plan and all questions were answered.  Told to use ice and heat over the area.  Patient has no neurological deficits noted on exam and normal extraocular movements  Final Clinical Impressions(s) / ED Diagnoses   Final diagnoses:  Injury of head, initial encounter    New Prescriptions Discharge Medication List as of 05/28/2016  6:14 PM    START taking these medications   Details  ibuprofen (ADVIL,MOTRIN) 800 MG tablet Take 1 tablet (800 mg total) by mouth every 8 (eight) hours as needed., Starting Mon 05/28/2016, Print    traMADol (ULTRAM) 50 MG tablet Take 1 tablet (50 mg total) by mouth every 6 (six) hours as needed for severe pain., Starting Mon 05/28/2016, Print         [employer], PA-C 05/30/16 0214    Daleen Bo, MD 05/30/16 1946

## 2016-06-11 ENCOUNTER — Other Ambulatory Visit: Payer: Self-pay | Admitting: *Deleted

## 2016-06-11 MED ORDER — METOPROLOL SUCCINATE ER 25 MG PO TB24: 25.0000 mg | ORAL_TABLET | Freq: Every day | ORAL | 5 refills | Status: DC

## 2016-09-18 ENCOUNTER — Other Ambulatory Visit: Payer: Self-pay | Admitting: Oncology

## 2016-09-24 ENCOUNTER — Telehealth: Payer: Self-pay | Admitting: *Deleted

## 2016-09-24 NOTE — Telephone Encounter (Signed)
Return call from Santiago Glad with Dr. Raylene Everts at [facility]: confirmed this referral was for radiation oncology only. Made her aware that patient is seeing Dr. Lisbeth Renshaw on 10/14/16 at 0830/0900 and has been called (per rad onc note).

## 2016-09-24 NOTE — Telephone Encounter (Signed)
Oncology Nurse Navigator Documentation  Oncology Nurse Navigator Flowsheets 09/24/2016  Navigator Location [facility]  Referral date to RadOnc/MedOnc 09/21/2016  Navigator Encounter Type Telephone  Telephone Outgoing Call  Received referral from [facility] via HIM department message. In review of records, it appears referral is for radiation oncology. Called and left VM for contact person from referral, Santiago Glad at [facility] requesting clarification. Does patient need to see rad onc/med onc or both? Forwarded referral to radiation oncology department.

## 2016-09-25 ENCOUNTER — Encounter: Payer: Self-pay | Admitting: Radiation Oncology

## 2016-09-25 NOTE — Progress Notes (Signed)
Metastatic pancreatic cancer  To Spine ,(liver,splenic,lung,L2,and pelvic metastasis)  Patient has been feeling generalized  Weakness, , dizziness, and light headed,some headaches,right hip pain,  MRI BRAIN WITHOUT AND WITH CONTRAST 09/18/16   INDICATION: eval for metastatic disease- lumbar mri noted leptomenigeal disease, G03.9 Meningitis, unspecified  COMPARISON: CT brain 09/06/2016  TECHNIQUE/PROTOCOL: Standard adult brain protocol.  CONTRAST: 45mL MultiHance IV. This MRI was performed before and after IV administration of contrast material. IV contrast was administered to improve disease detection and further define anatomy.  *GFR: Greater than 60 *Complications:No immediate patient complications or events noted.  FINDINGS:  Brain Parenchyma: There is leptomeningeal enhancement throughout the cerebellar folia. Additional foci of leptomeningeal enhancement to a lesser extent within the right occipital lobe, right parietal lobe, bilateral frontal lobes, and left temporal lobe. There is a probable component of intraparenchymal brain metastasis within the right occipital lobe likely the result of contiguous spread. No hemorrhage, cerebral edema, acute cortical infarction, mass effect, or midline shift. Ventricles and Sulci: Normal for age. Extra-Axial Spaces: No extra-axial fluid collection. Basal Cisterns: Normal. Intracranial Flow-Voids: Normal.  Paranasal Sinuses: Normal. Mastoid Sinuses: Normal. Orbits: Normal. Cranium: Normal. Other: Spondylitic changes at C3-4 with mild spinal stenosis.   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 There is a probable component of intraparenchymal brain metastasis within the right occipital lobe likely the result of contiguous spread.  Other Result ReportsCollapse All 2018 January MRI  cervical spine with and without contrast1/23/2018 [place] MRI cervical spine with and without contrast1/23/2018 [place] Result Narrative  ** MRI CERVICAL SPINE WITHOUT AND WITH CONTRAST ** ** MRI THORACIC SPINE WITHOUT AND WITH CONTRAST **  INDICATION: eval for metastatic disease- lumbar mri noted leptomenigeal disease, G03.9 Meningitis, unspecified  COMPARISON: CT chest abdomen pelvis 06/19/2016  TECHNIQUE/PROTOCOL:  HNP protocol cervical and thoracic spine pre and post contrast MRI performed.  CONTRAST: 25mL MultiHance IV. This MRI was performed before and after IV administration of contrast material. IV contrast was administered to improve disease detection and further define anatomy.  *GFR: [id number] *Complications:No immediate patient complications or events noted.  CERVICAL SPINE FINDINGS:  Alignment: Normal cervical spine alignment and craniocervical junction. Straightening of the normal lordotic curvature.  Spinal Cord: The visualized cord is normal in caliber and signal. There is leptomeningeal enhancement throughout the cervical spine, predominantly from C5 inferiorly.  Marrow Signal: Normal. Epidural Hematoma: None. Vertebral Body Heights: Normal. Intervertebral Discs: Normal.  Paraspinal Soft Tissues: Unremarkable. Neck Soft Tissues: Unremarkable.  C1-C2: Mild degenerative changes at the C1-2 articulation. C2-C3: Unremarkable C3-C4: Disc bulge with mild spinal stenosis. Uncovertebral joint disease resulting in severe bilateral foraminal stenosis. C4-C5: Disc bulge with mild spinal stenosis. Uncovertebral joint disease resulting in severe left and moderate right foraminal stenosis.Marland Kitchen C5-C6: Disc bulge without spinal stenosis. Uncovertebral joint disease resulting in mild bilateral foraminal stenosis. C6-C7: Unremarkable. C7-T1: Unremarkable.  THORACIC SPINE FINDINGS:  Alignment: Normal.  Spinal Cord: The  visualized cord is normal in caliber and signal. Leptomeningeal enhancement throughout the thoracic cord to the conus.  Conus: The conus terminates at approximately L1.  Marrow Signal: Normal. Patient's known L2 metastatic disease visualized on localizing sequence. Epidural Hematoma: None. Vertebral Body Heights: Normal. Intervertebral Discs: Normal.  Spinal Stenosis: No spinal stenosis. Disc Bulge or Herniation: None. Neuroforaminal Narrowing: No neuroforaminal narrowing.  Paraspinal Soft Tissues: Unremarkable. Other Soft Tissues: The posterior chest, posterior abdominal, and visceral soft  tissues demonstrate bilateral renal cysts. Liver and pulmonary metastasis better evaluated on prior CT chest abdomen pelvis.  IMPRESSION:  1. 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 2. Degenerative changes of the cervical spine with mild spinal stenosis at C3-4 and C4-5. 3. Liver and pulmonary metastasis better evaluated on prior CT chest abdomen pelvis. Known L2 metastatic lesion better evaluated on recent lumbar spine MRI. No additional osseous metastasis within the cervicothoracic spine.         Electronically Reviewed KB:[id number] Georgiana Spinner, MD Electronically Reviewed on:09/19/2016 10:50 AM  I have reviewed the images and concur with the above findings.  Electronically Signed [id number] Paschal Dopp, MD Electronically Signed on:09/19/2016 5:00 PM  Status Results Details    Hospital Encounter on 09/18/2016 [place]")' [email or web address] Summary  MRI thoracic spine with and without contrast1/23/2018 [place] Result Narrative  ** MRI CERVICAL SPINE WITHOUT AND WITH CONTRAST ** ** MRI THORACIC SPINE WITHOUT AND WITH CONTRAST **  INDICATION: eval for metastatic disease- lumbar mri noted  leptomenigeal disease, G03.9 Meningitis, unspecified  COMPARISON: CT chest abdomen pelvis 06/19/2016  TECHNIQUE/PROTOCOL:  HNP protocol cervical and thoracic spine pre and post contrast MRI performed.  CONTRAST: 2mL MultiHance IV. This MRI was performed before and after IV administration of contrast material. IV contrast was administered to improve disease detection and further define anatomy.  *GFR: Q000111Q *Complications:No immediate patient complications or events noted.  CERVICAL SPINE FINDINGS:  Alignment: Normal cervical spine alignment and craniocervical junction. Straightening of the normal lordotic curvature.  Spinal Cord: The visualized cord is normal in caliber and signal. There is leptomeningeal enhancement throughout the cervical spine, predominantly from C5 inferiorly.  Marrow Signal: Normal. Epidural Hematoma: None. Vertebral Body Heights: Normal. Intervertebral Discs: Normal.  Paraspinal Soft Tissues: Unremarkable. Neck Soft Tissues: Unremarkable.  C1-C2: Mild degenerative changes at the C1-2 articulation. C2-C3: Unremarkable C3-C4: Disc bulge with mild spinal stenosis. Uncovertebral joint disease resulting in severe bilateral foraminal stenosis. C4-C5: Disc bulge with mild spinal stenosis. Uncovertebral joint disease resulting in severe left and moderate right foraminal stenosis.Marland Kitchen C5-C6: Disc bulge without spinal stenosis. Uncovertebral joint disease resulting in mild bilateral foraminal stenosis. C6-C7: Unremarkable. C7-T1: Unremarkable.  THORACIC SPINE FINDINGS:  Alignment: Normal.  Spinal Cord: The visualized cord is normal in caliber and signal. Leptomeningeal enhancement throughout the thoracic cord to the conus.  Conus: The conus terminates at approximately L1.  Marrow Signal: Normal. Patient's known L2 metastatic disease visualized on localizing sequence. Epidural Hematoma: None. Vertebral Body Heights: Normal. Intervertebral Discs:  Normal.  Spinal Stenosis: No spinal stenosis. Disc Bulge or Herniation: None. Neuroforaminal Narrowing: No neuroforaminal narrowing.  Paraspinal Soft Tissues: Unremarkable. Other Soft Tissues: The posterior chest, posterior abdominal, and visceral soft tissues demonstrate bilateral renal cysts. Liver and pulmonary metastasis better evaluated on prior CT chest abdomen pelvis.  IMPRESSION:  1. 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 2. Degenerative changes of the cervical spine with mild spinal stenosis at C3-4 and C4-5. 3. Liver and pulmonary metastasis better evaluated on prior CT chest abdomen pelvis. Known L2 metastatic lesion better evaluated on recent lumbar spine MRI. No additional osseous metastasis within the cervicothoracic spine.   Xgeva q 3 months, last 05/2016,next due 08/2016,  Pain=10/10 low back and legs,  Took tylenol last night last pain med, tramadol not helping , vision  cannot see, eyes closed, or stand to get weighed, assist  of 2 to get on tabale,  Coffee offered, constipation, last bm yesterday, out of xanax,  Wants bottom looked at,  Says unable to have normal bowel movements, taking mir lax daily   Hx Liver Adenocarcinoma bx 06/06/15, hx breast cancer, b/l mastectomy 01/22/2007 Never smoked, no tobacco use, , no alcohol,  Falling and tripping on occasion, Sister breast cancer, Brother prostate cancer,   BP 136/88 (BP Location: Left Arm, Patient Position: Sitting, Cuff Size: Normal)   Pulse 74   Temp 98.4 F (36.9 C) (Oral)   Resp 20   Ht 5\' 6"  (1.676 m)   SpO2 100%  Wt Readings from Last 3 Encounters:  05/28/16 190 lb (86.2 kg)  10/19/15 182 lb (82.6 kg)  09/14/15 184 lb (83.5 kg)

## 2016-09-27 ENCOUNTER — Ambulatory Visit
Admission: RE | Admit: 2016-09-27 | Discharge: 2016-09-27 | Disposition: A | Discharge status: Home / Self Care | Payer: Medicare Other | Source: Ambulatory Visit | Attending: Radiation Oncology | Admitting: Radiation Oncology

## 2016-09-27 ENCOUNTER — Encounter: Payer: Self-pay | Admitting: Radiation Oncology

## 2016-09-27 VITALS — BP 136/88 | HR 74 | Temp 98.4°F | Resp 20 | Ht 66.0 in

## 2016-09-27 DIAGNOSIS — K8689 Other specified diseases of pancreas: Secondary | ICD-10-CM

## 2016-09-27 DIAGNOSIS — Z888 Allergy status to other drugs, medicaments and biological substances status: Secondary | ICD-10-CM | POA: Insufficient documentation

## 2016-09-27 DIAGNOSIS — Z91040 Latex allergy status: Secondary | ICD-10-CM | POA: Insufficient documentation

## 2016-09-27 DIAGNOSIS — Z853 Personal history of malignant neoplasm of breast: Secondary | ICD-10-CM | POA: Insufficient documentation

## 2016-09-27 DIAGNOSIS — E119 Type 2 diabetes mellitus without complications: Secondary | ICD-10-CM | POA: Diagnosis not present

## 2016-09-27 DIAGNOSIS — C250 Malignant neoplasm of head of pancreas: Secondary | ICD-10-CM | POA: Insufficient documentation

## 2016-09-27 DIAGNOSIS — E7800 Pure hypercholesterolemia, unspecified: Secondary | ICD-10-CM | POA: Insufficient documentation

## 2016-09-27 DIAGNOSIS — C7952 Secondary malignant neoplasm of bone marrow: Principal | ICD-10-CM

## 2016-09-27 DIAGNOSIS — Z510 Encounter for antineoplastic radiation therapy: Secondary | ICD-10-CM | POA: Insufficient documentation

## 2016-09-27 DIAGNOSIS — G039 Meningitis, unspecified: Secondary | ICD-10-CM

## 2016-09-27 DIAGNOSIS — Z8249 Family history of ischemic heart disease and other diseases of the circulatory system: Secondary | ICD-10-CM | POA: Diagnosis not present

## 2016-09-27 DIAGNOSIS — K219 Gastro-esophageal reflux disease without esophagitis: Secondary | ICD-10-CM | POA: Insufficient documentation

## 2016-09-27 DIAGNOSIS — I341 Nonrheumatic mitral (valve) prolapse: Secondary | ICD-10-CM | POA: Diagnosis not present

## 2016-09-27 DIAGNOSIS — Z87442 Personal history of urinary calculi: Secondary | ICD-10-CM | POA: Diagnosis not present

## 2016-09-27 DIAGNOSIS — Z79899 Other long term (current) drug therapy: Secondary | ICD-10-CM | POA: Diagnosis not present

## 2016-09-27 DIAGNOSIS — C7951 Secondary malignant neoplasm of bone: Secondary | ICD-10-CM

## 2016-09-27 DIAGNOSIS — C7931 Secondary malignant neoplasm of brain: Secondary | ICD-10-CM | POA: Insufficient documentation

## 2016-09-27 DIAGNOSIS — Z91013 Allergy to seafood: Secondary | ICD-10-CM | POA: Diagnosis not present

## 2016-09-27 DIAGNOSIS — I119 Hypertensive heart disease without heart failure: Secondary | ICD-10-CM | POA: Insufficient documentation

## 2016-09-27 DIAGNOSIS — G4730 Sleep apnea, unspecified: Secondary | ICD-10-CM | POA: Insufficient documentation

## 2016-09-27 DIAGNOSIS — Z880 Allergy status to penicillin: Secondary | ICD-10-CM | POA: Insufficient documentation

## 2016-09-27 DIAGNOSIS — M179 Osteoarthritis of knee, unspecified: Secondary | ICD-10-CM | POA: Insufficient documentation

## 2016-09-27 DIAGNOSIS — C787 Secondary malignant neoplasm of liver and intrahepatic bile duct: Secondary | ICD-10-CM | POA: Diagnosis not present

## 2016-09-27 DIAGNOSIS — G96198 Other disorders of meninges, not elsewhere classified: Secondary | ICD-10-CM

## 2016-09-27 HISTORY — DX: Malignant neoplasm of pancreas, unspecified: C25.9

## 2016-09-27 HISTORY — DX: Malignant neoplasm of unspecified site of unspecified female breast: C50.919

## 2016-09-27 MED ORDER — HYDROCODONE-ACETAMINOPHEN 5-325 MG PO TABS: 1.0000 | ORAL_TABLET | ORAL | 0 refills | Status: DC | PRN

## 2016-09-27 MED ORDER — ONDANSETRON HCL 8 MG PO TABS: 8.0000 mg | ORAL_TABLET | Freq: Two times a day (BID) | ORAL | 0 refills | Status: DC | PRN

## 2016-09-27 NOTE — Progress Notes (Signed)
Please see the Nurse Progress Note in the MD Initial Consult Encounter for this patient. 

## 2016-09-27 NOTE — Progress Notes (Signed)
Radiation Oncology         [phone] ________________________________  Name: [name] MRN: [id number]  Date: 09/27/2016  DOB: February 08, 1951  WU:JWJXB,JYNWGN NEVILL, MD  Ivette Loyal, MD     REFERRING PHYSICIAN: Ivette Loyal, MD   DIAGNOSIS: The primary encounter diagnosis was Pancreatic mass. Diagnoses of Secondary malignant neoplasm of bone and bone marrow (HCC) and Leptomeningeal disease were also pertinent to this visit.   HISTORY OF PRESENT ILLNESS: [name] is a 66 y.o. female seen at the request of Dr. Raylene Everts for a history of stage IV adenocarcinoma of the pancreas. The paitent was diagnosed by liver biopsy in the right lobe with an adenocarcinoma at the time involving the liver, the tail of the pancreas, and in the lungs, porta hepatis, and porta-caval region, with a sclerotic proximal left femur. She has been on systemic therapy with FOLFIRINOX, though progression was noted. Her most recent regimen has been Gemcitabine/Cisplatin. Unfortunately she's progressed with new disease in the liver, lumbar spine, and concern for leptomeningeal disease seen on brain imaging. Her MRI of the cervical, thoracic, and lumbar spine was performed revealing concerns for extensive nodular enhancement coating the visualized distal thoracic cord and conus as well as cauda equina nerve roots. Abnormal focal enhancement at the left L2 vertebral body and pedicle as well as the left sacrum was noted. No evidence of cord edema was present. Her MRI brain on 09/19/16 revealed concerns with leptomeningeal metastasis primarily in the posterior cranial fossa and to a lesser extent within the cerebral hemispheres bilaterally in the right occipital lobe there is also concern for intraparenchymal metastases. She has been counseled on the role for palliative chemotherapy versus palliative care, and comes today to discuss the role of palliative radiation in the treatment of her disease.  PREVIOUS  RADIATION THERAPY: No   PAST MEDICAL HISTORY:  Past Medical History:  Diagnosis Date  . Aortic stenosis   . Arthritis    knee  . Breast cancer ([place]) 11/25/2006   left breast  . Cancer ([place]) 06/06/2015   liver   . Diabetes mellitus   . GERD (gastroesophageal reflux disease)   . Headache(784.0)    hx migraines , none in past year  . Heart murmur   . History of kidney stones    passed  2  . History of migraines   . HX: breast cancer   . Hypercholesterolemia   . Hypertension   . Hypertensive cardiovascular disease   . MVP (mitral valve prolapse)   . Pancreatic cancer ([place])   . Sleep apnea        PAST SURGICAL HISTORY: Past Surgical History:  Procedure Laterality Date  . HIP FRACTURE SURGERY Left 1968  . KNEE ARTHROSCOPY Right 11/05/2013   Procedure: ARTHROSCOPY RIGHT KNEE WITH DEBRIDEMENT;  Surgeon: Marin Shutter, MD;  Location: [facility];  Service: Orthopedics;  Laterality: Right;  . mastectomy Bilateral 2008   bilateral Dr.Streck  . MASTECTOMY Bilateral   . TUBAL LIGATION  1994     FAMILY HISTORY:  Family History  Problem Relation Age of Onset  . Hypertension Mother   . Hypertension Father      SOCIAL HISTORY:  reports that she has never smoked. She has never used smokeless tobacco. She reports that she does not drink alcohol or use drugs. 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   ALLERGIES: Shellfish allergy; Prednisone; Iodine; Latex; Penicillins; Pravachol; Pravastatin; and Rosuvastatin  MEDICATIONS:  Current Outpatient Prescriptions  Medication Sig Dispense Refill  . acetaminophen (TYLENOL) 500 MG tablet Take 1,000 mg by mouth every 6 (six) hours as needed for moderate pain.    Marland Kitchen allopurinol (ZYLOPRIM) 300 MG tablet Take 150 mg by mouth daily as needed. gout  2  . amLODipine (NORVASC) 5 MG tablet Take 5 mg by mouth daily.  5  . calcium citrate-vitamin D (CITRACAL+D) 315-200 MG-UNIT tablet Take 1 tablet by mouth  daily.    . diphenhydrAMINE (BENADRYL) 25 mg capsule Take 50 mg by mouth at bedtime as needed. itching    . hydrochlorothiazide (HYDRODIURIL) 25 MG tablet Take 1 tablet (25 mg total) by mouth daily. 30 tablet 0  . ibuprofen (ADVIL,MOTRIN) 800 MG tablet Take 1 tablet (800 mg total) by mouth every 8 (eight) hours as needed. 21 tablet 0  . lipase/protease/amylase (CREON) 36000 UNITS CPEP capsule Take 72,000 capsules by mouth 2 (two) times daily at 8 am and 10 pm. Take 2 capsules before breakfast and dinner    . metoprolol succinate (TOPROL-XL) 25 MG 24 hr tablet Take 1 tablet (25 mg total) by mouth daily. 30 tablet 5  . ondansetron (ZOFRAN) 8 MG tablet Take 1 tablet (8 mg total) by mouth 2 (two) times daily as needed. nausea 30 tablet 0  . pantoprazole (PROTONIX) 40 MG tablet Take 40 mg by mouth daily as needed (indigestion).    . traMADol (ULTRAM) 50 MG tablet Take 1 tablet (50 mg total) by mouth every 6 (six) hours as needed for severe pain. 15 tablet 0  . ALPRAZolam (XANAX) 0.25 MG tablet Take 0.25-50 mg by mouth at bedtime.  5  . HYDROcodone-acetaminophen (NORCO) 5-325 MG tablet Take 1 tablet by mouth every 4 (four) hours as needed for moderate pain. 60 tablet 0  . naproxen (EC NAPROSYN) 500 MG EC tablet Take 500 mg by mouth 2 (two) times daily as needed for pain. For gout     No current facility-administered medications for this encounter.      REVIEW OF SYSTEMS: On review of systems, the patient reports that she is not doing well overall. She reports pain 10/10 in severity in her lower back and legs, which she describes as an intermittent stabbing pain. She took Tylenol last night for this pain, and reports she has Tramadol but does not like to take it because it does not help. 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 She reports she cannot see and her vision is very blurry. If she covers  one eye she can see, but cannot see with both eyes; this began 3 weeks ago. She reports shortness of breath when she is "exhausted," as well as when she experiences vertigo. She reports constipation and a feeling of numbness to her anus. Her her last bowel movement was yesterday. She is taking Miralax daily. The patient denies bladder concerns. She denies nausea or vomiting. A complete review of systems is obtained and is otherwise negative.    PHYSICAL EXAM:  Wt Readings from Last 3 Encounters:  05/28/16 190 lb (86.2 kg)  10/19/15 182 lb (82.6 kg)  09/14/15 184 lb (83.5 kg)   Temp Readings from Last 3 Encounters:  09/27/16 98.4 F (36.9 C) (Oral)  05/28/16 98.3 F (36.8 C) (Oral)  10/19/15 98.5 F (36.9 C) (Oral)   BP Readings from Last 3 Encounters:  09/27/16 136/88  05/28/16 150/85  10/19/15 159/81   Pulse Readings from Last 3 Encounters:  09/27/16 74  05/28/16 (!) 55  10/19/15 (!) 55   In general this is a chronically ill appearing African American female in acute distress. She is alert and oriented x4 and appropriate throughout the examination. Pupils were not checked. Skin is intact without any evidence of gross lesions. Cardiovascular exam reveals a regular rate and rhythm, no clicks rubs or murmurs are auscultated. Chest is clear to auscultation bilaterally. Lymphatic assessment is performed and does not reveal any adenopathy in the cervical, supraclavicular, axillary, or inguinal chains. Abdomen has active bowel sounds in all quadrants and is intact. The abdomen is soft, non tender, non distended. Lower extremities are negative for pretibial pitting edema, deep calf tenderness, cyanosis or clubbing. Neurologically, she has 5/5 strength in the upper extremities bilaterally. In the lower extremities however she is unable to participate in the exam, she has questionable delayed DTRs bilaterally, and is unable to stand without assistance. Little touch perception is intact of her face,  upper and lower extremities. No visible abnormalities were seen of the rectum and digital rectal exam does not reveal stool in the vault of palpable mass. Of note, she does describe abnormality in sensation with little touch of the anus.  ECOG = 4  0 - Asymptomatic (Fully active, able to carry on all predisease activities without restriction)  1 - Symptomatic but completely ambulatory (Restricted in physically strenuous activity but ambulatory and able to carry out work of a light or sedentary nature. For example, light housework, office work)  2 - Symptomatic, <50% in bed during the day (Ambulatory and capable of all self care but unable to carry out any work activities. Up and about more than 50% of waking hours)  3 - Symptomatic, >50% in bed, but not bedbound (Capable of only limited self-care, confined to bed or chair 50% or more of waking hours)  4 - Bedbound (Completely disabled. Cannot carry on any self-care. Totally confined to bed or chair)  5 - Death   Eustace Pen MM, Creech RH, Tormey DC, et al. [phone]). "Toxicity and response criteria of the [facility]". [place] Oncol. 5 (6): 649-55    LABORATORY DATA:  Lab Results  Component Value Date   WBC 7.1 10/19/2015   HGB 8.4 (L) 10/19/2015   HCT 24.8 (L) 10/19/2015   MCV 91.5 10/19/2015   PLT 170 10/19/2015   Lab Results  Component Value Date   NA 140 10/19/2015   K 3.9 10/19/2015   CL 106 10/19/2015   CO2 24 10/19/2015   Lab Results  Component Value Date   ALT 27 01/18/2015   AST 23 01/18/2015   ALKPHOS 125 01/18/2015   BILITOT 0.6 01/18/2015      RADIOGRAPHY: No results found.     IMPRESSION/PLAN: 1. Progressive Stage IV adenocarcinoma of the pancreas with bony and leptomeningeal spread. Dr. Lisbeth Renshaw met today with the patient about the findings and work-up thus far.  We discussed the natural history of  Metastatic Pancreatic cancer with lung, liver, and brain involvement and general treatment,  highlighting the role of radiotherapy in the management.  We discussed the available radiation techniques, including craniospinal radiation, and focused on the details of logistics and delivery.  We reviewed the anticipated acute and late sequelae associated with radiation in this setting. Dr. Lisbeth Renshaw has recommended craniospinal radiotherapy over 6 weeks.  The patient would like to proceed with radiation and scheduled for CT simulation  Monday 10/01/16 at 9 am. 2. Pain management. We discussed the patient's significant pain and her current pain management strategies. I will prescribe hydrocodone (she's had trouble tolerating oxycodone due to nausea) to help manage the patient's pain. I will also prescribe Zofran. We also reviewed the role that steroids may play in alleviating the patient's pain. 3. Palliative Care and Home Health. We discussed the option of referring the patient to palliative care. She is interested in these services and will be scheduled to meet with Stanton Kidney on 10/01/16 following her CT simulation. The patient is also interested in a referral to [facility] services for a safety evaluation of the home and necessary safety devices.  The above documentation reflects my direct findings during this shared patient visit. Please see the separate note by Dr. Lisbeth Renshaw on this date for the remainder of the patient's plan of care.    Carola Rhine, PAC  This document serves as a record of services personally performed by Kyung Rudd, MD and Shona Simpson, PA-C. It was created on their behalf by Maryla Morrow, a trained medical scribe. The creation of this record is based on the scribe's personal observations and the provider's statements to them. This document has been checked and approved by the attending provider.

## 2016-10-01 ENCOUNTER — Other Ambulatory Visit: Payer: Self-pay | Admitting: Radiation Oncology

## 2016-10-01 ENCOUNTER — Encounter: Payer: Self-pay | Admitting: Radiation Oncology

## 2016-10-01 ENCOUNTER — Ambulatory Visit
Admission: RE | Admit: 2016-10-01 | Discharge: 2016-10-01 | Disposition: A | Discharge status: Home / Self Care | Payer: Medicare Other | Source: Ambulatory Visit | Attending: Radiation Oncology | Admitting: Radiation Oncology

## 2016-10-01 ENCOUNTER — Telehealth: Payer: Self-pay | Admitting: *Deleted

## 2016-10-01 VITALS — BP 158/85 | HR 88 | Temp 97.9°F | Resp 20

## 2016-10-01 DIAGNOSIS — C7931 Secondary malignant neoplasm of brain: Secondary | ICD-10-CM

## 2016-10-01 DIAGNOSIS — C250 Malignant neoplasm of head of pancreas: Secondary | ICD-10-CM

## 2016-10-01 DIAGNOSIS — Z510 Encounter for antineoplastic radiation therapy: Secondary | ICD-10-CM | POA: Diagnosis not present

## 2016-10-01 MED ORDER — MORPHINE SULFATE 4 MG/ML IJ SOLN
2.0000 mg | Freq: Once | INTRAMUSCULAR | Status: AC
  Administered 2016-10-01: 2 mg via INTRAMUSCULAR
  Filled 2016-10-01: qty 1

## 2016-10-01 MED ORDER — DEXAMETHASONE 2 MG PO TABS: 4.0000 mg | ORAL_TABLET | Freq: Two times a day (BID) | ORAL | 0 refills | Status: AC

## 2016-10-01 NOTE — Telephone Encounter (Signed)
Called  Patient at home, asked how she was after her ct simulation, "she is doing well,eating lunch,  and let her know that RX  decadron 4mg  oral to be taken bid, has been called to her pharmacy, patient stated"I can't take prednisone youknow', informed her this is dexamethasone not prednisone, to all [place] for any signs of symptoms if they occur 12:15 PM

## 2016-10-01 NOTE — Progress Notes (Addendum)
[  t c/o pain 10/10 b/l legs lying on ct sim table,  She had taken E.S> tylenol (3tabs), no relief, verbal order to give 2mg  IM Morphine now x 1, witnessed by Joaquim Lai RN, gave 2mg  IM Morphine  Via left deltoid, to patient after name and dob as identification,  [id number] AM  took vitals and asked pain level  CT simulation done, pain a 3/10 scale stated patient,  She knows no driving today [id number] AM

## 2016-10-04 ENCOUNTER — Encounter: Payer: Self-pay | Admitting: *Deleted

## 2016-10-04 NOTE — Progress Notes (Signed)
[facility] Psychosocial Distress Screening Clinical Social Work  Clinical Social Work was referred by distress screening protocol.  The patient scored a 8 on the Psychosocial Distress Thermometer which indicates severe distress. Clinical Social Worker reviewed chart and phoned pt to assess for distress and other psychosocial needs. CSW introduced self and explained role of CSW/Support Team. Pt shared she is really struggling to manage her ADLs and self care at home, needs help with bathing and dressing. 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 Pt reports she was set up with [facility] for RN and PT, but really needs more help than that currently and as a result she refused these services. CSW explained that Medicare will only cover [facility] services for a skilled need, such as PT/OT or RN. Medicare does not cover CNA types of services. Pt stated understanding, but reports she cannot afford those resources. Pt makes over the amount to qualify for medicaid, but just a little over. CSW discussed other possible resources, such as grant funds. CSW to make a referral to financial counselors for further assistance.  CSW educated pt about support through [employer]. CSW inquired if she had met with Mary/Palliative Care to date and pt was not sure what I was referring to. CSW explained pt could get additional support. Pt could get CNA services at home if hospice eligible. Referral to hospice/palliative care could be very helpful to pt with advanced disease. Pt described many side effects from this and then got dizzy and had to get off the phone. CSW to follow and make appropriate referrals.   ONCBCN DISTRESS SCREENING 09/27/2016  Screening Type Initial Screening  Distress experienced in past week (1-10) 8  Emotional problem type Adjusting to illness  Information Concerns Type Lack of info about treatment  Physical Problem type Pain;Getting  around;Constipation/diarrhea;Tingling hands/feet  Physician notified of physical symptoms Yes  Referral to clinical social work Yes    Clinical Social Worker follow up needed: Yes.    If yes, follow up plan: Referral to [place], LCSW, OSW-C Clinical Social Worker [place] Phone: [phone] Fax: [phone]

## 2016-10-05 DIAGNOSIS — Z510 Encounter for antineoplastic radiation therapy: Secondary | ICD-10-CM | POA: Diagnosis not present

## 2016-10-10 ENCOUNTER — Ambulatory Visit: Payer: Medicare Other

## 2016-10-10 ENCOUNTER — Ambulatory Visit: Payer: Medicare Other | Admitting: Radiation Oncology

## 2016-10-11 ENCOUNTER — Ambulatory Visit
Admission: RE | Admit: 2016-10-11 | Discharge: 2016-10-11 | Disposition: A | Discharge status: Home / Self Care | Payer: Medicare Other | Source: Ambulatory Visit | Attending: Radiation Oncology | Admitting: Radiation Oncology

## 2016-10-11 ENCOUNTER — Ambulatory Visit: Payer: Medicare Other

## 2016-10-11 DIAGNOSIS — C250 Malignant neoplasm of head of pancreas: Secondary | ICD-10-CM

## 2016-10-11 MED ORDER — BIAFINE EX EMUL
Freq: Every day | CUTANEOUS | Status: DC
  Administered 2016-10-11: 12:00:00 via TOPICAL

## 2016-10-11 NOTE — Progress Notes (Addendum)
Pt education  Head/spine, radiation therapy and you book, my business ard, biafine cream, discussed side effects, n,v,d,loss appetite, fatigue,  thorat irritation,skin irritation, irritation of esophagus, may need to eat 5-6 smaller meals, soft foods, increase protein in diet, stay hydrated, low fiber diet and imodium prn for diarrhea, nausea, clear liquids, brat diet, ,  Apply biafine to affected skin areas after rad tx daily, and prn, nothing 4 hours prior to rad txs, sees MD weekly and prn, , patient drowsy , took tramadol, eyes closed, answers by nodding yes or no to questions, discussed with son and daughter, will re-emphasize Friday, teach back given,

## 2016-10-12 ENCOUNTER — Ambulatory Visit
Admission: RE | Admit: 2016-10-12 | Discharge: 2016-10-12 | Disposition: A | Discharge status: Home / Self Care | Payer: Medicare Other | Source: Ambulatory Visit | Attending: Radiation Oncology | Admitting: Radiation Oncology

## 2016-10-12 ENCOUNTER — Encounter: Payer: Self-pay | Admitting: Radiation Oncology

## 2016-10-12 VITALS — BP 173/91 | HR 70 | Temp 98.0°F | Resp 16

## 2016-10-12 DIAGNOSIS — C250 Malignant neoplasm of head of pancreas: Secondary | ICD-10-CM

## 2016-10-12 NOTE — Progress Notes (Signed)
Department of Radiation Oncology  Phone:  [phone] Fax:        [phone]  Weekly Treatment Note    Name: [name] Date: 10/12/2016 MRN: [id number] DOB: Jan 10, 1951   Diagnosis:     ICD-9-CM ICD-10-CM   1. Malignant neoplasm of head of pancreas ([place]) 157.0 C25.0      Current dose: 5 Gy  Current fraction: 2   MEDICATIONS: Current Outpatient Prescriptions  Medication Sig Dispense Refill  . acetaminophen (TYLENOL) 500 MG tablet Take 1,000 mg by mouth every 6 (six) hours as needed for moderate pain.    Marland Kitchen allopurinol (ZYLOPRIM) 300 MG tablet Take 150 mg by mouth daily as needed. gout  2  . ALPRAZolam (XANAX) 0.25 MG tablet Take 0.25-50 mg by mouth at bedtime.  5  . amLODipine (NORVASC) 5 MG tablet Take 5 mg by mouth daily.  5  . calcium citrate-vitamin D (CITRACAL+D) 315-200 MG-UNIT tablet Take 1 tablet by mouth daily.    Marland Kitchen dexamethasone (DECADRON) 2 MG tablet Take 2 tablets (4 mg total) by mouth 2 (two) times daily. 60 tablet 0  . diphenhydrAMINE (BENADRYL) 25 mg capsule Take 50 mg by mouth at bedtime as needed. itching    . emollient (BIAFINE) cream Apply 1 application topically daily. Apply to head and spine  Daily after radiation    . hydrochlorothiazide (HYDRODIURIL) 25 MG tablet Take 1 tablet (25 mg total) by mouth daily. 30 tablet 0  . HYDROcodone-acetaminophen (NORCO) 5-325 MG tablet Take 1 tablet by mouth every 4 (four) hours as needed for moderate pain. 60 tablet 0  . ibuprofen (ADVIL,MOTRIN) 800 MG tablet Take 1 tablet (800 mg total) by mouth every 8 (eight) hours as needed. 21 tablet 0  . lipase/protease/amylase (CREON) 36000 UNITS CPEP capsule Take 72,000 capsules by mouth 2 (two) times daily at 8 am and 10 pm. Take 2 capsules before breakfast and dinner    . metoprolol succinate (TOPROL-XL) 25 MG 24 hr tablet Take 1 tablet (25 mg total) by mouth daily. 30 tablet 5  . naproxen (EC NAPROSYN) 500 MG EC tablet Take 500 mg by mouth 2 (two) times  daily as needed for pain. For gout    . ondansetron (ZOFRAN) 8 MG tablet Take 1 tablet (8 mg total) by mouth 2 (two) times daily as needed. nausea 30 tablet 0  . pantoprazole (PROTONIX) 40 MG tablet Take 40 mg by mouth daily as needed (indigestion).    . traMADol (ULTRAM) 50 MG tablet Take 1 tablet (50 mg total) by mouth every 6 (six) hours as needed for severe pain. 15 tablet 0   No current facility-administered medications for this encounter.      ALLERGIES: Shellfish allergy; Prednisone; Iodine; Latex; Penicillins; Pravachol; Pravastatin; and Rosuvastatin   LABORATORY DATA:  Lab Results  Component Value Date   WBC 7.1 10/19/2015   HGB 8.4 (L) 10/19/2015   HCT 24.8 (L) 10/19/2015   MCV 91.5 10/19/2015   PLT 170 10/19/2015   Lab Results  Component Value Date   NA 140 10/19/2015   K 3.9 10/19/2015   CL 106 10/19/2015   CO2 24 10/19/2015   Lab Results  Component Value Date   ALT 27 01/18/2015   AST 23 01/18/2015   ALKPHOS 125 01/18/2015   BILITOT 0.6 01/18/2015     NARRATIVE: [name] was seen today for weekly treatment management. The chart was checked and the patient's films were reviewed.  The patient states she is doing  fairly well. She did have questions about physical therapy. She states she has had some home health help but was wondering about physical therapy most prominently.   Weekly rad txs 2/14 cns/csi, wears a patch over left eye, unable to stand for weight, pt education done yesterday, no c/o pain, just weak and fatigued,  Drinks boost 1-2 daily stated , patient more alert today,, no c/o pain, blurred vision in both eyes, takes 4mg  decadron bid, asking about physical therapy when will she start? 7:08 PM BP (!) 173/91 (BP Location: Right Arm, Patient Position: Sitting, Cuff Size: Normal)   Pulse 70   Temp 98 F (36.7 C) (Oral)   Resp 16   Wt Readings from Last 3 Encounters:  05/28/16 190 lb (86.2 kg)  10/19/15 182 lb (82.6 kg)  09/14/15  184 lb (83.5 kg)    PHYSICAL EXAMINATION: oral temperature is 98 F (36.7 C). Her blood pressure is 173/91 (abnormal) and her pulse is 70. Her respiration is 16.      Sitting in a wheelchair, in no acute distress  ASSESSMENT: The patient is doing satisfactorily with treatment.  PLAN: We will continue with the patient's radiation treatment as planned.  The patient may benefit from palliative medicine and I will see if I can help coordinate this.

## 2016-10-12 NOTE — Progress Notes (Signed)
[facility] Counseling Note  Called pt per supervisor and CSW recommendation to check in on emotional wellbeing and remind pt of [employer] [facility].  Pt stated that she is "doing okay" emotionally and is looking forward to starting physical therapy soon, which she believes will elevate her mood. Pt stated she could not "get a straight answer" about when PT will begin and asked counselor to schedule it; counselor encouraged pt to ask her doctor about the details of PT at her appt today (2/16) and stated she would f/u with pt next week on PT status.   Pt was not interested in individual counseling services at this time.  Counselor will f/u with patient next week (Thursday, 2/22).  [employer] [phone]

## 2016-10-12 NOTE — Progress Notes (Addendum)
Weekly rad txs 2/14 cns/csi, wears a patch over left eye, unable to stand for weight, pt education done yesterday, no c/o pain, just weak and fatigued,  Drinks boost 1-2 daily stated , patient more alert today,, no c/o pain, blurred vision in both eyes, takes 4mg  decadron bid, asking about physical therapy when will she start? 3:43 PM BP (!) 173/91 (BP Location: Right Arm, Patient Position: Sitting, Cuff Size: Normal)   Pulse 70   Temp 98 F (36.7 C) (Oral)   Resp 16   Wt Readings from Last 3 Encounters:  05/28/16 190 lb (86.2 kg)  10/19/15 182 lb (82.6 kg)  09/14/15 184 lb (83.5 kg)

## 2016-10-14 ENCOUNTER — Inpatient Hospital Stay (HOSPITAL_COMMUNITY)
Admission: EM | Admit: 2016-10-14 | Discharge: 2016-10-19 | DRG: 092 | Disposition: A | Discharge status: Hospice | Payer: Medicare Other | Attending: Internal Medicine | Admitting: Internal Medicine

## 2016-10-14 ENCOUNTER — Encounter (HOSPITAL_COMMUNITY): Payer: Self-pay | Admitting: Emergency Medicine

## 2016-10-14 ENCOUNTER — Emergency Department (HOSPITAL_COMMUNITY): Payer: Medicare Other

## 2016-10-14 DIAGNOSIS — Z66 Do not resuscitate: Secondary | ICD-10-CM

## 2016-10-14 DIAGNOSIS — M545 Low back pain, unspecified: Secondary | ICD-10-CM

## 2016-10-14 DIAGNOSIS — C250 Malignant neoplasm of head of pancreas: Secondary | ICD-10-CM | POA: Diagnosis present

## 2016-10-14 DIAGNOSIS — D638 Anemia in other chronic diseases classified elsewhere: Secondary | ICD-10-CM | POA: Diagnosis present

## 2016-10-14 DIAGNOSIS — G4730 Sleep apnea, unspecified: Secondary | ICD-10-CM | POA: Diagnosis present

## 2016-10-14 DIAGNOSIS — R339 Retention of urine, unspecified: Secondary | ICD-10-CM | POA: Diagnosis present

## 2016-10-14 DIAGNOSIS — G9529 Other cord compression: Secondary | ICD-10-CM | POA: Diagnosis not present

## 2016-10-14 DIAGNOSIS — Z91013 Allergy to seafood: Secondary | ICD-10-CM

## 2016-10-14 DIAGNOSIS — C787 Secondary malignant neoplasm of liver and intrahepatic bile duct: Secondary | ICD-10-CM | POA: Diagnosis present

## 2016-10-14 DIAGNOSIS — K219 Gastro-esophageal reflux disease without esophagitis: Secondary | ICD-10-CM | POA: Diagnosis present

## 2016-10-14 DIAGNOSIS — M6281 Muscle weakness (generalized): Secondary | ICD-10-CM

## 2016-10-14 DIAGNOSIS — I341 Nonrheumatic mitral (valve) prolapse: Secondary | ICD-10-CM | POA: Diagnosis present

## 2016-10-14 DIAGNOSIS — R030 Elevated blood-pressure reading, without diagnosis of hypertension: Secondary | ICD-10-CM

## 2016-10-14 DIAGNOSIS — Z91041 Radiographic dye allergy status: Secondary | ICD-10-CM

## 2016-10-14 DIAGNOSIS — R32 Unspecified urinary incontinence: Secondary | ICD-10-CM | POA: Diagnosis present

## 2016-10-14 DIAGNOSIS — Z79899 Other long term (current) drug therapy: Secondary | ICD-10-CM

## 2016-10-14 DIAGNOSIS — I10 Essential (primary) hypertension: Secondary | ICD-10-CM | POA: Diagnosis present

## 2016-10-14 DIAGNOSIS — Z888 Allergy status to other drugs, medicaments and biological substances status: Secondary | ICD-10-CM

## 2016-10-14 DIAGNOSIS — E860 Dehydration: Secondary | ICD-10-CM | POA: Diagnosis present

## 2016-10-14 DIAGNOSIS — K5900 Constipation, unspecified: Secondary | ICD-10-CM | POA: Diagnosis not present

## 2016-10-14 DIAGNOSIS — Z853 Personal history of malignant neoplasm of breast: Secondary | ICD-10-CM

## 2016-10-14 DIAGNOSIS — S83241A Other tear of medial meniscus, current injury, right knee, initial encounter: Secondary | ICD-10-CM | POA: Diagnosis present

## 2016-10-14 DIAGNOSIS — Z9013 Acquired absence of bilateral breasts and nipples: Secondary | ICD-10-CM

## 2016-10-14 DIAGNOSIS — C7800 Secondary malignant neoplasm of unspecified lung: Secondary | ICD-10-CM | POA: Diagnosis present

## 2016-10-14 DIAGNOSIS — I119 Hypertensive heart disease without heart failure: Secondary | ICD-10-CM | POA: Diagnosis present

## 2016-10-14 DIAGNOSIS — M1711 Unilateral primary osteoarthritis, right knee: Secondary | ICD-10-CM | POA: Diagnosis present

## 2016-10-14 DIAGNOSIS — G893 Neoplasm related pain (acute) (chronic): Secondary | ICD-10-CM

## 2016-10-14 DIAGNOSIS — E871 Hypo-osmolality and hyponatremia: Secondary | ICD-10-CM | POA: Diagnosis present

## 2016-10-14 DIAGNOSIS — Z515 Encounter for palliative care: Secondary | ICD-10-CM

## 2016-10-14 DIAGNOSIS — E861 Hypovolemia: Secondary | ICD-10-CM | POA: Diagnosis present

## 2016-10-14 DIAGNOSIS — E119 Type 2 diabetes mellitus without complications: Secondary | ICD-10-CM | POA: Diagnosis present

## 2016-10-14 DIAGNOSIS — R29898 Other symptoms and signs involving the musculoskeletal system: Secondary | ICD-10-CM | POA: Diagnosis present

## 2016-10-14 DIAGNOSIS — D72829 Elevated white blood cell count, unspecified: Secondary | ICD-10-CM | POA: Diagnosis present

## 2016-10-14 DIAGNOSIS — E7800 Pure hypercholesterolemia, unspecified: Secondary | ICD-10-CM | POA: Diagnosis present

## 2016-10-14 DIAGNOSIS — Z923 Personal history of irradiation: Secondary | ICD-10-CM

## 2016-10-14 DIAGNOSIS — C7931 Secondary malignant neoplasm of brain: Secondary | ICD-10-CM | POA: Diagnosis present

## 2016-10-14 DIAGNOSIS — C7951 Secondary malignant neoplasm of bone: Secondary | ICD-10-CM | POA: Diagnosis present

## 2016-10-14 DIAGNOSIS — I350 Nonrheumatic aortic (valve) stenosis: Secondary | ICD-10-CM | POA: Diagnosis present

## 2016-10-14 DIAGNOSIS — Z91040 Latex allergy status: Secondary | ICD-10-CM

## 2016-10-14 LAB — CBC WITH DIFFERENTIAL/PLATELET
BASOS ABS: 0 10*3/uL (ref 0.0–0.1)
Basophils Relative: 0 %
EOS ABS: 0 10*3/uL (ref 0.0–0.7)
Eosinophils Relative: 0 %
HCT: 29.9 % — ABNORMAL LOW (ref 36.0–46.0)
HEMOGLOBIN: 10.1 g/dL — AB (ref 12.0–15.0)
LYMPHS ABS: 0.5 10*3/uL — AB (ref 0.7–4.0)
Lymphocytes Relative: 3 %
MCH: 28.1 pg (ref 26.0–34.0)
MCHC: 33.8 g/dL (ref 30.0–36.0)
MCV: 83.3 fL (ref 78.0–100.0)
Monocytes Absolute: 0.9 10*3/uL (ref 0.1–1.0)
Monocytes Relative: 6 %
NEUTROS PCT: 91 %
Neutro Abs: 12.3 10*3/uL — ABNORMAL HIGH (ref 1.7–7.7)
PLATELETS: 151 10*3/uL (ref 150–400)
RBC: 3.59 MIL/uL — AB (ref 3.87–5.11)
RDW: 15.7 % — ABNORMAL HIGH (ref 11.5–15.5)
WBC: 13.6 10*3/uL — AB (ref 4.0–10.5)

## 2016-10-14 LAB — COMPREHENSIVE METABOLIC PANEL
ALBUMIN: 3.7 g/dL (ref 3.5–5.0)
ALK PHOS: 236 U/L — AB (ref 38–126)
ALT: 50 U/L (ref 14–54)
AST: 37 U/L (ref 15–41)
Anion gap: 10 (ref 5–15)
BUN: 37 mg/dL — AB (ref 6–20)
CALCIUM: 9.8 mg/dL (ref 8.9–10.3)
CHLORIDE: 99 mmol/L — AB (ref 101–111)
CO2: 21 mmol/L — AB (ref 22–32)
CREATININE: 0.84 mg/dL (ref 0.44–1.00)
GFR calc non Af Amer: >60 mL/min (ref >60)
GLUCOSE: 124 mg/dL — AB (ref 65–99)
Potassium: 4.5 mmol/L (ref 3.5–5.1)
SODIUM: 130 mmol/L — AB (ref 135–145)
Total Bilirubin: 1.3 mg/dL — ABNORMAL HIGH (ref 0.3–1.2)
Total Protein: 7 g/dL (ref 6.5–8.1)

## 2016-10-14 LAB — I-STAT CHEM 8, ED
BUN: 37 mg/dL — AB (ref 6–20)
CALCIUM ION: 1.22 mmol/L (ref 1.15–1.40)
CREATININE: 0.9 mg/dL (ref 0.44–1.00)
Chloride: 100 mmol/L — ABNORMAL LOW (ref 101–111)
GLUCOSE: 125 mg/dL — AB (ref 65–99)
HCT: 33 % — ABNORMAL LOW (ref 36.0–46.0)
HEMOGLOBIN: 11.2 g/dL — AB (ref 12.0–15.0)
Potassium: 4.5 mmol/L (ref 3.5–5.1)
Sodium: 131 mmol/L — ABNORMAL LOW (ref 135–145)
TCO2: 22 mmol/L (ref 0–100)

## 2016-10-14 LAB — I-STAT TROPONIN, ED: Troponin i, poc: 0.04 ng/mL (ref 0.00–0.08)

## 2016-10-14 MED ORDER — MORPHINE SULFATE (PF) 4 MG/ML IV SOLN
4.0000 mg | Freq: Four times a day (QID) | INTRAVENOUS | Status: DC | PRN
  Administered 2016-10-15 – 2016-10-17 (×5): 4 mg via INTRAVENOUS
  Filled 2016-10-14 (×5): qty 1

## 2016-10-14 MED ORDER — MORPHINE BOLUS VIA INFUSION: 4.0000 mg | Freq: Four times a day (QID) | INTRAVENOUS | Status: DC | PRN

## 2016-10-14 MED ORDER — MORPHINE SULFATE ER 15 MG PO TBCR
15.0000 mg | EXTENDED_RELEASE_TABLET | Freq: Two times a day (BID) | ORAL | Status: DC
  Administered 2016-10-16 – 2016-10-17 (×4): 15 mg via ORAL
  Filled 2016-10-14 (×4): qty 1

## 2016-10-14 MED ORDER — GADOBENATE DIMEGLUMINE 529 MG/ML IV SOLN
18.0000 mL | Freq: Once | INTRAVENOUS | Status: AC | PRN
  Administered 2016-10-14: 18 mL via INTRAVENOUS

## 2016-10-14 MED ORDER — SONAFINE EX EMUL
1.0000 "application " | Freq: Every day | CUTANEOUS | Status: DC
  Filled 2016-10-14 (×2): qty 1

## 2016-10-14 MED ORDER — PANTOPRAZOLE SODIUM 40 MG PO TBEC: 40.0000 mg | DELAYED_RELEASE_TABLET | Freq: Every day | ORAL | Status: DC | PRN

## 2016-10-14 MED ORDER — ONDANSETRON HCL 4 MG/2ML IJ SOLN
4.0000 mg | Freq: Once | INTRAMUSCULAR | Status: AC
  Administered 2016-10-14: 4 mg via INTRAVENOUS
  Filled 2016-10-14: qty 2

## 2016-10-14 MED ORDER — HYDROCHLOROTHIAZIDE 25 MG PO TABS
25.0000 mg | ORAL_TABLET | Freq: Every day | ORAL | Status: DC
  Administered 2016-10-16 – 2016-10-17 (×2): 25 mg via ORAL
  Filled 2016-10-14 (×2): qty 1

## 2016-10-14 MED ORDER — AMLODIPINE BESYLATE 5 MG PO TABS
5.0000 mg | ORAL_TABLET | Freq: Every day | ORAL | Status: DC
Start: 2016-10-14 — End: 2016-10-19
  Administered 2016-10-16 – 2016-10-19 (×3): 5 mg via ORAL
  Filled 2016-10-14 (×4): qty 1

## 2016-10-14 MED ORDER — DEXAMETHASONE 4 MG PO TABS
4.0000 mg | ORAL_TABLET | Freq: Four times a day (QID) | ORAL | Status: DC
  Filled 2016-10-14: qty 1

## 2016-10-14 MED ORDER — SODIUM CHLORIDE 0.9 % IV BOLUS (SEPSIS)
1000.0000 mL | Freq: Once | INTRAVENOUS | Status: AC
  Administered 2016-10-14: 1000 mL via INTRAVENOUS

## 2016-10-14 MED ORDER — ONDANSETRON HCL 8 MG PO TABS: 8.0000 mg | ORAL_TABLET | Freq: Three times a day (TID) | ORAL | Status: DC | PRN

## 2016-10-14 MED ORDER — MORPHINE SULFATE (PF) 4 MG/ML IV SOLN
4.0000 mg | Freq: Four times a day (QID) | INTRAVENOUS | Status: DC | PRN
  Administered 2016-10-14: 4 mg via INTRAVENOUS
  Filled 2016-10-14: qty 1

## 2016-10-14 MED ORDER — HYDROMORPHONE HCL 2 MG/ML IJ SOLN
2.0000 mg | INTRAMUSCULAR | Status: DC | PRN
  Administered 2016-10-14 – 2016-10-17 (×13): 2 mg via INTRAVENOUS
  Filled 2016-10-14 (×13): qty 1

## 2016-10-14 MED ORDER — CALCIUM CARBONATE-VITAMIN D 500-200 MG-UNIT PO TABS
1.0000 | ORAL_TABLET | Freq: Every day | ORAL | Status: DC
  Administered 2016-10-16 – 2016-10-17 (×2): 1 via ORAL
  Filled 2016-10-14 (×2): qty 1

## 2016-10-14 MED ORDER — PANCRELIPASE (LIP-PROT-AMYL) 12000-38000 UNITS PO CPEP
72000.0000 [IU] | ORAL_CAPSULE | Freq: Two times a day (BID) | ORAL | Status: DC
  Administered 2016-10-16: 72000 [IU] via ORAL
  Filled 2016-10-14 (×4): qty 6

## 2016-10-14 MED ORDER — METOPROLOL SUCCINATE ER 25 MG PO TB24
25.0000 mg | ORAL_TABLET | Freq: Every day | ORAL | Status: DC
  Administered 2016-10-16 – 2016-10-17 (×2): 25 mg via ORAL
  Filled 2016-10-14 (×2): qty 1

## 2016-10-14 MED ORDER — CALCIUM CITRATE-VITAMIN D 315-200 MG-UNIT PO TABS: 1.0000 | ORAL_TABLET | Freq: Every day | ORAL | Status: DC

## 2016-10-14 MED ORDER — KCL IN DEXTROSE-NACL 20-5-0.9 MEQ/L-%-% IV SOLN
INTRAVENOUS | Status: DC
  Administered 2016-10-14: 1000 mL via INTRAVENOUS
  Administered 2016-10-15: 02:00:00 via INTRAVENOUS
  Administered 2016-10-15: 1000 mL via INTRAVENOUS
  Filled 2016-10-14 (×3): qty 1000

## 2016-10-14 MED ORDER — ENOXAPARIN SODIUM 40 MG/0.4ML ~~LOC~~ SOLN
40.0000 mg | SUBCUTANEOUS | Status: DC
  Administered 2016-10-15 – 2016-10-17 (×3): 40 mg via SUBCUTANEOUS
  Filled 2016-10-14 (×3): qty 0.4

## 2016-10-14 MED ORDER — EMOLLIENT BASE EX CREA: 1.0000 "application " | TOPICAL_CREAM | Freq: Every day | CUTANEOUS | Status: DC

## 2016-10-14 MED ORDER — BISACODYL 5 MG PO TBEC: 5.0000 mg | DELAYED_RELEASE_TABLET | Freq: Every day | ORAL | Status: DC

## 2016-10-14 MED ORDER — HYDROMORPHONE HCL 1 MG/ML IJ SOLN
1.0000 mg | Freq: Once | INTRAMUSCULAR | Status: AC
  Administered 2016-10-14: 1 mg via INTRAVENOUS
  Filled 2016-10-14: qty 1

## 2016-10-14 MED ORDER — MAGNESIUM CITRATE PO SOLN: 1.0000 | Freq: Once | ORAL | Status: DC | PRN

## 2016-10-14 MED ORDER — DEXAMETHASONE SODIUM PHOSPHATE 10 MG/ML IJ SOLN
4.0000 mg | Freq: Four times a day (QID) | INTRAMUSCULAR | Status: DC
  Administered 2016-10-14 – 2016-10-18 (×13): 4 mg via INTRAVENOUS
  Filled 2016-10-14 (×14): qty 1

## 2016-10-14 NOTE — ED Provider Notes (Signed)
[facility] DEPT Provider Note   CSN: [id number] Arrival date & time: 10/14/16  0840     History   Chief Complaint Chief Complaint  Patient presents with  . Fatigue    HPI [name] is a 66 y.o. female.  66 yo F with a chief complaint of worsening low back pain and lower extremity weakness. Going on for the past week or so. Patient is getting radiation therapy for leptomeningitis, feels that her symptoms have significantly been worsening. Felt that she was so weak that she was unable to use her legs this morning. Having upper back pain as well as abdominal pain. Nausea denies vomiting. Having loss of bowel and bladder.   The history is provided by the patient and the spouse.  Illness  This is a new problem. The current episode started more than 2 days ago. The problem occurs constantly. The problem has not changed since onset.Associated symptoms include abdominal pain. Pertinent negatives include no chest pain, no headaches and no shortness of breath. Nothing aggravates the symptoms. Nothing relieves the symptoms. She has tried nothing for the symptoms. The treatment provided no relief.    Past Medical History:  Diagnosis Date  . Aortic stenosis   . Arthritis    knee  . Breast cancer ([place]) 11/25/2006   left breast  . Cancer ([place]) 06/06/2015   liver   . Diabetes mellitus   . GERD (gastroesophageal reflux disease)   . Headache(784.0)    hx migraines , none in past year  . Heart murmur   . History of kidney stones    passed  2  . History of migraines   . HX: breast cancer   . Hypercholesterolemia   . Hypertension   . Hypertensive cardiovascular disease   . MVP (mitral valve prolapse)   . Pancreatic cancer ([place])   . Sleep apnea     Patient Active Problem List   Diagnosis Date Noted  . Weakness of lower extremity 10/14/2016  . Constipation 10/14/2016  . Urinary retention 10/14/2016  . Cancer associated pain 10/14/2016  . 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 Malignant neoplasm of head of pancreas ([place])   . Liver lesion   . Right knee pain 08/18/2013  . Patellar tendinitis 08/18/2013  . Tear of medial meniscus of right knee 08/18/2013  . Left shoulder pain 05/15/2012  . Dyspepsia 02/04/2012  . External hemorrhoid 10/15/2011  . Aortic stenosis   . Hypercholesterolemia   . MVP (mitral valve prolapse)   . Hypertensive cardiovascular disease   . HX: breast cancer   . History of migraines   . Disorders of bursae and tendons in shoulder region, unspecified 09/20/2008  . Type 2 diabetes mellitus ([place]) 07/10/2007  . Hypertension 07/10/2007  . AORTIC STENOSIS 07/10/2007    Past Surgical History:  Procedure Laterality Date  . HIP FRACTURE SURGERY Left 1968  . KNEE ARTHROSCOPY Right 11/05/2013   Procedure: ARTHROSCOPY RIGHT KNEE WITH DEBRIDEMENT;  Surgeon: Marin Shutter, MD;  Location: [facility];  Service: Orthopedics;  Laterality: Right;  . mastectomy Bilateral 2008   bilateral Dr.Streck  . MASTECTOMY Bilateral   . TUBAL LIGATION  1994    OB History    No data available       Home Medications    Prior to Admission medications   Medication Sig Start Date End Date Taking? Authorizing Provider  amLODipine (NORVASC) 5 MG tablet Take 5 mg by mouth  daily. 05/09/16  Yes Historical Provider, MD  calcium citrate-vitamin D (CITRACAL+D) 315-200 MG-UNIT tablet Take 1 tablet by mouth daily.   Yes Historical Provider, MD  dexamethasone (DECADRON) 2 MG tablet Take 2 tablets (4 mg total) by mouth 2 (two) times daily. 10/01/16  Yes Hayden Pedro, PA-C  emollient (BIAFINE) cream Apply 1 application topically daily. Apply to head and spine  Daily after radiation 10/12/16  Yes Historical Provider, MD  hydrochlorothiazide (HYDRODIURIL) 25 MG tablet Take 1 tablet (25 mg total) by mouth daily. 10/19/15  Yes Gareth Morgan, MD  HYDROcodone-acetaminophen (NORCO) 5-325 MG tablet Take 1 tablet by  mouth every 4 (four) hours as needed for moderate pain. 09/27/16  Yes Hayden Pedro, PA-C  ibuprofen (ADVIL,MOTRIN) 800 MG tablet Take 1 tablet (800 mg total) by mouth every 8 (eight) hours as needed. 05/28/16  Yes Dalia Heading, PA-C  lipase/protease/amylase (CREON) 36000 UNITS CPEP capsule Take 72,000 capsules by mouth 2 (two) times daily at 8 am and 10 pm. Take 2 capsules before breakfast and dinner 08/23/15  Yes Historical Provider, MD  Magnesium Oxide 400 (240 Mg) MG TABS Take 1 tablet (400 mg total) by mouth once daily. 09/03/16  Yes Historical Provider, MD  metoprolol succinate (TOPROL-XL) 25 MG 24 hr tablet Take 1 tablet (25 mg total) by mouth daily. 06/11/16  Yes Skeet Latch, MD  morphine (MSIR) 15 MG tablet Take 15 mg by mouth every 4 (four) hours as needed. for pain 09/20/16  Yes Historical Provider, MD  ondansetron (ZOFRAN) 8 MG tablet Take 1 tablet (8 mg total) by mouth 2 (two) times daily as needed. nausea 09/27/16  Yes Hayden Pedro, PA-C  pantoprazole (PROTONIX) 40 MG tablet Take 40 mg by mouth daily as needed (indigestion).   Yes Historical Provider, MD  traMADol (ULTRAM) 50 MG tablet Take 1 tablet (50 mg total) by mouth every 6 (six) hours as needed for severe pain. Patient not taking: Reported on 10/14/2016 05/28/16   Dalia Heading, PA-C    Family History Family History  Problem Relation Age of Onset  . Hypertension Mother   . Hypertension Father     Social History Social History  Substance Use Topics  . Smoking status: Never Smoker  . Smokeless tobacco: Never Used  . Alcohol use No     Allergies   Shellfish allergy; Prednisone; Iodine; Latex; Penicillins; Pravachol; Pravastatin; and Rosuvastatin   Review of Systems Review of Systems  Constitutional: Negative for chills and fever.  HENT: Negative for congestion and rhinorrhea.   Eyes: Negative for redness and visual disturbance.  Respiratory: Negative for shortness of breath and wheezing.     Cardiovascular: Negative for chest pain and palpitations.  Gastrointestinal: Positive for abdominal pain. Negative for nausea and vomiting.  Genitourinary: Negative for dysuria and urgency.  Musculoskeletal: Positive for arthralgias, back pain and myalgias.  Skin: Negative for pallor and wound.  Neurological: Negative for dizziness and headaches.     Physical Exam Updated Vital Signs BP (!) 141/93 (BP Location: Right Arm)   Pulse 74   Temp 98 F (36.7 C) (Oral)   Resp 16   SpO2 100%   Physical Exam  Constitutional: She is oriented to person, place, and time. She appears well-developed and well-nourished. No distress.  HENT:  Head: Normocephalic and atraumatic.  Eyes: EOM are normal. Pupils are equal, round, and reactive to light.  Neck: Normal range of motion. Neck supple.  Cardiovascular: Normal rate and regular rhythm.  Exam reveals no gallop  and no friction rub.   No murmur heard. Pulmonary/Chest: Effort normal. She has no wheezes. She has no rales.  Abdominal: Soft. She exhibits no distension and no mass. There is tenderness. There is no guarding.  Musculoskeletal: She exhibits tenderness (worse to the right low back). She exhibits no edema.  Neurological: She is alert and oriented to person, place, and time.  Skin: Skin is warm and dry. She is not diaphoretic.  Psychiatric: She has a normal mood and affect. Her behavior is normal.  Nursing note and vitals reviewed.    ED Treatments / Results  Labs (all labs ordered are listed, but only abnormal results are displayed) Labs Reviewed  CBC WITH DIFFERENTIAL/PLATELET - Abnormal; Notable for the following:       Result Value   WBC 13.6 (*)    RBC 3.59 (*)    Hemoglobin 10.1 (*)    HCT 29.9 (*)    RDW 15.7 (*)    Neutro Abs 12.3 (*)    Lymphs Abs 0.5 (*)    All other components within normal limits  COMPREHENSIVE METABOLIC PANEL - Abnormal; Notable for the following:    Sodium 130 (*)    Chloride 99 (*)    CO2 21  (*)    Glucose, Bld 124 (*)    BUN 37 (*)    Alkaline Phosphatase 236 (*)    Total Bilirubin 1.3 (*)    All other components within normal limits  I-STAT CHEM 8, ED - Abnormal; Notable for the following:    Sodium 131 (*)    Chloride 100 (*)    BUN 37 (*)    Glucose, Bld 125 (*)    Hemoglobin 11.2 (*)    HCT 33.0 (*)    All other components within normal limits  HEMOGLOBIN A1C  I-STAT TROPOININ, ED    EKG  EKG Interpretation None       Radiology Dg Chest 2 View  Result Date: 10/14/2016 CLINICAL DATA:  66 year old female with history of generalize weakness following chemotherapy 2 days ago. History of breast and pancreatic cancer. EXAM: CHEST  2 VIEW COMPARISON:  Chest x-ray 10/19/2015. FINDINGS: Small nodular density projecting over the right lung base new compared to the prior examination, potentially within the right middle or lower lobe (not clearly visualized on the lateral projection). Linear opacities in the left mid to lower lung, similar to prior study, most compatible with areas of mild chronic scarring. No acute consolidative airspace disease. No pleural effusions. No evidence of pulmonary edema. Heart size is normal. Upper mediastinal contours are within normal limits. Right internal jugular single-lumen porta cath with tip terminating at the superior cavoatrial junction. IMPRESSION: 1. No radiographic evidence of acute cardiopulmonary disease. 2. Small nodular density projecting over either the right middle or lower lobe. Given the patient's documented history of two primary malignancies, the possibility of metastatic disease should be considered, and further evaluation with nonemergent noncontrast chest CT is recommended in the near future. Electronically Signed   By: Vinnie Langton M.D.   On: 10/14/2016 10:33   Ct Head Wo Contrast  Result Date: 10/14/2016 CLINICAL DATA:  Leg weakness, side not specified.  On chemotherapy. EXAM: CT HEAD WITHOUT CONTRAST TECHNIQUE:  Contiguous axial images were obtained from the base of the skull through the vertex without intravenous contrast. COMPARISON:  05/28/2016 FINDINGS: Brain: No evidence of acute infarction, hemorrhage, hydrocephalus, extra-axial collection or mass lesion/mass effect. Subtle posterior right frontal sulcal density on axial slice 18 is  not convincing for pathology on reformats. Vascular: Mild atherosclerotic calcification.  No hyperdense vessel Skull: No visible metastasis. Sinuses/Orbits: Negative IMPRESSION: No acute finding or change from prior. Electronically Signed   By: Monte Fantasia M.D.   On: 10/14/2016 10:18   Ct Thoracic Spine Wo Contrast  Result Date: 10/14/2016 CLINICAL DATA:  RIGHT flank pain, generalized weakness, pancreatic cancer, breast cancer, had chemotherapy 2 days ago EXAM: CT THORACIC AND LUMBAR SPINE WITHOUT CONTRAST TECHNIQUE: Multidetector CT imaging of the thoracic and lumbar spine was performed without contrast. Multiplanar CT image reconstructions were also generated. COMPARISON:  CT chest abdomen pelvis 05/26/2015 FINDINGS: CT THORACIC SPINE FINDINGS Alignment: Normal Vertebrae: 12 pairs of ribs. Vertebral body heights maintained without fracture or bone destruction. Generalized mild osseous demineralization. Paraspinal and other soft tissues: No abnormal paraspinal soft tissue. Visible mediastinum unremarkable. Coronary arterial calcification noted. Disc levels: Scattered multilevel disc space narrowing and small endplate spurs. No obvious disc herniation or intraspinal mass. CT LUMBAR SPINE FINDINGS Segmentation: 5 non-rib-bearing lumbar vertebra Alignment: Normal Vertebrae: Sclerosis within the LEFT lateral aspect of the L2 vertebral body question sclerotic versus treated osseous metastasis. Patient had a subtle lucent lesion at this site on the prior CT exam. No additional focal osseous lesions identified. Facet degenerative changes lower lumbar spine. Sclerosis adjacent to RIGHT SI  joint appears unchanged. Questionable sclerotic foci in RIGHT ilium image 242 new and LEFT sacrum image 230, more prominent. Paraspinal and other soft tissues: No focal disc herniation or neural compression. Paraspinal soft tissues normal appearance. Normal appendix. Disc levels: Mildly bulging discs at L3-L4, L4-L5, and L5-S1. No definite focal disc herniation. IMPRESSION: CT THORACIC SPINE IMPRESSION Mild scattered degenerative disc disease changes. No acute thoracic spine abnormalities. CT LUMBAR SPINE IMPRESSION Sclerotic lesions within the LEFT lateral aspect of the L2 vertebral body, RIGHT ilium, and LEFT sacrum, question sclerotic versus treated osseous metastases. Facet degenerative changes lower lumbar spine. Electronically Signed   By: Lavonia Dana M.D.   On: 10/14/2016 10:46   Ct Lumbar Spine Wo Contrast  Result Date: 10/14/2016 CLINICAL DATA:  RIGHT flank pain, generalized weakness, pancreatic cancer, breast cancer, had chemotherapy 2 days ago EXAM: CT THORACIC AND LUMBAR SPINE WITHOUT CONTRAST TECHNIQUE: Multidetector CT imaging of the thoracic and lumbar spine was performed without contrast. Multiplanar CT image reconstructions were also generated. COMPARISON:  CT chest abdomen pelvis 05/26/2015 FINDINGS: CT THORACIC SPINE FINDINGS Alignment: Normal Vertebrae: 12 pairs of ribs. Vertebral body heights maintained without fracture or bone destruction. Generalized mild osseous demineralization. Paraspinal and other soft tissues: No abnormal paraspinal soft tissue. Visible mediastinum unremarkable. Coronary arterial calcification noted. Disc levels: Scattered multilevel disc space narrowing and small endplate spurs. No obvious disc herniation or intraspinal mass. CT LUMBAR SPINE FINDINGS Segmentation: 5 non-rib-bearing lumbar vertebra Alignment: Normal Vertebrae: Sclerosis within the LEFT lateral aspect of the L2 vertebral body question sclerotic versus treated osseous metastasis. Patient had a subtle  lucent lesion at this site on the prior CT exam. No additional focal osseous lesions identified. Facet degenerative changes lower lumbar spine. Sclerosis adjacent to RIGHT SI joint appears unchanged. Questionable sclerotic foci in RIGHT ilium image 242 new and LEFT sacrum image 230, more prominent. Paraspinal and other soft tissues: No focal disc herniation or neural compression. Paraspinal soft tissues normal appearance. Normal appendix. Disc levels: Mildly bulging discs at L3-L4, L4-L5, and L5-S1. No definite focal disc herniation. IMPRESSION: CT THORACIC SPINE IMPRESSION Mild scattered degenerative disc disease changes. No acute thoracic spine abnormalities. CT LUMBAR SPINE  IMPRESSION Sclerotic lesions within the LEFT lateral aspect of the L2 vertebral body, RIGHT ilium, and LEFT sacrum, question sclerotic versus treated osseous metastases. Facet degenerative changes lower lumbar spine. Electronically Signed   By: Lavonia Dana M.D.   On: 10/14/2016 10:46   Mr Thoracic Spine W Wo Contrast  Result Date: 10/14/2016 CLINICAL DATA:  66 year old female with generalized weakness. Pancreatic and breast cancer status post chemotherapy 2 days ago. Right flank pain. Hepatic metastatic disease. EXAM: MRI THORACIC WITHOUT AND WITH CONTRAST TECHNIQUE: Multiplanar and multiecho pulse sequences of the thoracic spine were obtained without and with intravenous contrast. CONTRAST:  18mL MULTIHANCE GADOBENATE DIMEGLUMINE 529 MG/ML IV SOLN COMPARISON:  CT Abdomen and Pelvis 1006 hours today. CT thoracic and lumbar spine 1013 hours today. Chest CT 05/26/2015. FINDINGS: Limited sagittal imaging of the cervical spine is normal aside from straightening of lordosis. Alignment: Thoracic vertebral height and alignment is stable since 2016. Vertebrae: Thoracic bone marrow signal remains normal. No thoracic marrow edema or evidence of acute osseous abnormality. There is a sclerotic, largely nonenhancing metastasis in L2 affecting the body,  left pedicle and left transverse process (series 5, image 7 and series 12, image 45). Cord: Abnormal plaque-like enhancement along virtually the entire surface of the thoracic spinal cord (series 11 images 6 through 8 and series 12 images 9, 12, and 23. No associated spinal cord enlargement or edema. At the conus medullaris there is evidence of widespread proximal cauda equina nerve root enhancement (series 12, image 40) this is also evident as mild nodularity on the surface of the conus on sagittal T2 (series 10, image 6). The conus is at T12-L1. Paraspinal and other soft tissues: Visualized mediastinum and lung parenchyma is grossly negative. Visualized upper abdominal viscera are stable from the earlier CT Abdomen and Pelvis Disc levels: Age concordant thoracic disc degeneration. No thoracic spinal stenosis. IMPRESSION: 1. Positive for diffuse spinal leptomeningeal metastatic disease. Involvement of the thoracic spinal cord and visible cauda equina. No associated spinal cord edema. 2. No osseous metastatic disease or acute osseous abnormality in the thoracic spine. 3. L2 vertebral metastasis appears treated, with little to no edema or enhancement. Electronically Signed   By: Genevie Ann M.D.   On: 10/14/2016 13:56   Ct Renal Stone Study  Result Date: 10/14/2016 CLINICAL DATA:  Right flank pain. Evaluate for ureteral calculus. Undergoing chemotherapy for pancreatic and breast cancer. EXAM: CT ABDOMEN AND PELVIS WITHOUT CONTRAST TECHNIQUE: Multidetector CT imaging of the abdomen and pelvis was performed following the standard protocol without IV contrast. COMPARISON:  Abdominopelvic CT 05/26/2015. FINDINGS: Lower chest: There is fluctuating nodularity at the visualized lung bases. A focal irregular 9 mm nodule has developed adjacent to a right lower lobe granuloma on image 14. An irregular nodule previously noted more medially in the right lower lobe has resolved. There are new small solid right lower lobe nodules  measuring 5 mm on image 18 and 30. There is also a new sub solid nodule in the left lower lobe on image 7. No significant pleural or pericardial effusion. Probable aortic valvular calcifications again noted. Hepatobiliary: Hepatic evaluation is limited by the lack of intravenous contrast. However, there are multiple hepatic metastases which have enlarged compared with the available prior study. The largest lesions measure 3.7 x 2.3 cm anteriorly in the dome of the right lobe (image number 8) and 5.0 x 3.9 cm peripherally in the inferior right lobe (image 22, previously 3.0 cm maximally). Several other smaller lesions are present. No  evidence of gallstones, gallbladder wall thickening or biliary dilatation. Pancreas: No residual pancreatic tail mass identified. There is no pancreatic ductal dilatation or surrounding inflammation. Spleen: Normal in size without focal abnormality. Adrenals/Urinary Tract: There is new soft tissue thickening and nodularity of both adrenal glands, measuring up to 1.6 x 2.1 cm on the right and 2.5 x 1.4 cm on the left. Low-density renal cysts are grossly stable. There is no evidence of urinary tract calculus or hydronephrosis. The bladder appears unremarkable. Stomach/Bowel: No evidence of bowel wall thickening, distention or surrounding inflammatory change. There is moderate stool throughout the colon. The appendix appears normal. Vascular/Lymphatic: Previously noted prominent lymph nodes in the porta hepatis appear mildly improved. There is no retroperitoneal lymphadenopathy. No significant vascular findings are seen on noncontrast imaging. Reproductive: The uterus and ovaries appear unremarkable. No evidence of adnexal mass. Other: Postsurgical changes in the anterior abdominal wall. No evidence of hernia, ascites or peritoneal nodularity. Musculoskeletal: Interval development of multiple sclerotic lesions consistent with treated metastatic disease. These are most prominent within the  left aspect of the L2 vertebral body, the left sacrum, the right iliac bone and the proximal right femur. Mixed sclerotic and lucent lesion in the intertrochanteric region of the left femur is unchanged. No evidence of pathologic fracture. IMPRESSION: 1. No evidence of urinary tract calculus or hydronephrosis. 2. Progressive multifocal hepatic metastatic disease compared with available prior study from 2016. 3. Multiple sclerotic lesions are noted throughout the bones consistent with treated metastatic disease. 4. Interval development of bilateral adrenal gland thickening and nodularity, potentially metastatic disease as well. 5. Fluctuating nodularity of the lung bases with several new nodules as described. Electronically Signed   By: Richardean Sale M.D.   On: 10/14/2016 10:36    Procedures Procedures (including critical care time)  Medications Ordered in ED Medications  enoxaparin (LOVENOX) injection 40 mg (not administered)  dextrose 5 % and 0.9 % NaCl with KCl 20 mEq/L infusion (not administered)  bisacodyl (DULCOLAX) EC tablet 5 mg (not administered)  magnesium citrate solution 1 Bottle (not administered)  morphine (MS CONTIN) 12 hr tablet 15 mg (not administered)  lipase/protease/amylase (CREON) capsule 72,000 Units (not administered)  pantoprazole (PROTONIX) EC tablet 40 mg (not administered)  hydrochlorothiazide (HYDRODIURIL) tablet 25 mg (not administered)  amLODipine (NORVASC) tablet 5 mg (not administered)  metoprolol succinate (TOPROL-XL) 24 hr tablet 25 mg (not administered)  ondansetron (ZOFRAN) tablet 8 mg (not administered)  emollient (BIAFINE) cream 1 application (not administered)  dexamethasone (DECADRON) tablet 4 mg (not administered)  sodium chloride 0.9 % bolus 1,000 mL (1,000 mLs Intravenous Transfusing/Transfer 10/14/16 1414)  HYDROmorphone (DILAUDID) injection 1 mg (1 mg Intravenous Given 10/14/16 1027)  ondansetron (ZOFRAN) injection 4 mg (4 mg Intravenous Given  10/14/16 1027)  gadobenate dimeglumine (MULTIHANCE) injection 18 mL (18 mLs Intravenous Contrast Given 10/14/16 1341)     Initial Impression / Assessment and Plan / ED Course  I have reviewed the triage vital signs and the nursing notes.  Pertinent labs & imaging results that were available during my care of the patient were reviewed by me and considered in my medical decision making (see chart for details).     66 yo F With symptoms concerning for cauda equina with loss of bowel and bladder. Patient does have a poor prognosis with her current cancer. We'll attempt to get in touch with radiation oncology and discuss. Discussed with Dr. Lisbeth Renshaw, rad onc, recommended MRI.  If acute changes likely would need emergent radiation therapy.  Admit.   The patients results and plan were reviewed and discussed.   Any x-rays performed were independently reviewed by myself.   Differential diagnosis were considered with the presenting HPI.  Medications  enoxaparin (LOVENOX) injection 40 mg (not administered)  dextrose 5 % and 0.9 % NaCl with KCl 20 mEq/L infusion (not administered)  bisacodyl (DULCOLAX) EC tablet 5 mg (not administered)  magnesium citrate solution 1 Bottle (not administered)  morphine (MS CONTIN) 12 hr tablet 15 mg (not administered)  lipase/protease/amylase (CREON) capsule 72,000 Units (not administered)  pantoprazole (PROTONIX) EC tablet 40 mg (not administered)  hydrochlorothiazide (HYDRODIURIL) tablet 25 mg (not administered)  amLODipine (NORVASC) tablet 5 mg (not administered)  metoprolol succinate (TOPROL-XL) 24 hr tablet 25 mg (not administered)  ondansetron (ZOFRAN) tablet 8 mg (not administered)  emollient (BIAFINE) cream 1 application (not administered)  dexamethasone (DECADRON) tablet 4 mg (not administered)  sodium chloride 0.9 % bolus 1,000 mL (1,000 mLs Intravenous Transfusing/Transfer 10/14/16 1414)  HYDROmorphone (DILAUDID) injection 1 mg (1 mg Intravenous Given  10/14/16 1027)  ondansetron (ZOFRAN) injection 4 mg (4 mg Intravenous Given 10/14/16 1027)  gadobenate dimeglumine (MULTIHANCE) injection 18 mL (18 mLs Intravenous Contrast Given 10/14/16 1341)    Vitals:   10/14/16 0853 10/14/16 1103 10/14/16 1414 10/14/16 1510  BP:  155/90 150/84 (!) 141/93  Pulse:  68 78 74  Resp:  20 18 16   Temp:  97.6 F (36.4 C)  98 F (36.7 C)  TempSrc:  Oral  Oral  SpO2: 100% 100% 100% 100%    Final diagnoses:  Low back pain  Weakness of both lower extremities  Urinary incontinence, unspecified type    Admission/ observation were discussed with the admitting physician, patient and/or family and they are comfortable with the plan.    Final Clinical Impressions(s) / ED Diagnoses   Final diagnoses:  Low back pain  Weakness of both lower extremities  Urinary incontinence, unspecified type    New Prescriptions Current Discharge Medication List       Deno Etienne, DO 10/14/16 1521

## 2016-10-14 NOTE — ED Notes (Signed)
Oncologist, MD at bedsite

## 2016-10-14 NOTE — Progress Notes (Signed)
Pt seems more comfortable after administration of dilaudid IV. Pt is not very interactive, she doesn't open her eyes when talking with her. She continues to decline to take PO medications. Called MD and had decadron changed from PO to IV so pt would at least get that med in tonight. Continue to monitor. Hortencia Conradi RN

## 2016-10-14 NOTE — ED Notes (Signed)
Port has been accessed.  Blood returned and fluids are N/S fluids are running.

## 2016-10-14 NOTE — ED Notes (Signed)
Bladder scan of 642 ml

## 2016-10-14 NOTE — ED Notes (Signed)
Attempted to call report, nurse will call back.  She is in another room.

## 2016-10-14 NOTE — ED Notes (Signed)
Patient transported to MRI 

## 2016-10-14 NOTE — ED Notes (Signed)
Patient requesting to rest a little bit longer before trying to give urine sample. Patient still rating her pain at about a 6 RN aware.

## 2016-10-14 NOTE — ED Notes (Signed)
Bed: [id number] Expected date: 10/14/16 Expected time: 8:41 AM Means of arrival: Ambulance Comments: CA Pt weakness

## 2016-10-14 NOTE — H&P (Addendum)
History and Physical    [name] [id number] DOB: [birth year] DOA: 10/14/2016  PCP: Henrine Screws, MD   Patient coming from: home  Chief Complaint: pain, inability to void, lower extremity weakness  HPI: [name] is a 66 y.o. 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 Started palliative radiation of spinal cord last week in attempt to improve symptoms. Tolerating some po but in severe pain. Takes miralax for stool softening. No unilateral weakness/numbness.  ED Course: mri thoracic spine after consulting with on-call radiation oncologist  Review of Systems: As per HPI otherwise 10 point review of systems negative.    Past Medical History:  Diagnosis Date  . Aortic stenosis   . Arthritis    knee  . Breast cancer ([place]) 11/25/2006   left breast  . Cancer ([place]) 06/06/2015   liver   . Diabetes mellitus   . GERD (gastroesophageal reflux disease)   . Headache(784.0)    hx migraines , none in past year  . Heart murmur   . History of kidney stones    passed  2  . History of migraines   . HX: breast cancer   . Hypercholesterolemia   . Hypertension   . Hypertensive cardiovascular disease   . MVP (mitral valve prolapse)   . Pancreatic cancer ([place])   . Sleep apnea     Past Surgical History:  Procedure Laterality Date  . HIP FRACTURE SURGERY Left 1968  . KNEE ARTHROSCOPY Right 11/05/2013   Procedure: ARTHROSCOPY RIGHT KNEE WITH DEBRIDEMENT;  Surgeon: Marin Shutter, MD;  Location: [facility];  Service: Orthopedics;  Laterality: Right;  . mastectomy Bilateral 2008   bilateral Dr.Streck  . MASTECTOMY Bilateral   . TUBAL LIGATION  1994      reports that she has never smoked. She has never used smokeless tobacco. She reports that she does not drink alcohol or use drugs.  Allergies  Allergen Reactions  . Shellfish Allergy Anaphylaxis  . Prednisone Other (See Comments)  . Iodine Other (See Comments)    unknown  . Latex Rash  . Penicillins Hives and Itching    Has patient had a PCN reaction causing immediate rash, facial/tongue/throat swelling, SOB or lightheadedness with hypotension: yes Has patient had a PCN reaction causing severe rash involving mucus membranes or skin necrosis: no Has patient had a PCN reaction that required hospitalization : ed visit Has patient had a PCN reaction occurring within the last 10 years: yes If all of the above answers are "NO", then may proceed with Cephalosporin use.   . Pravachol Itching and Other (See Comments)    Muscle pain  . Pravastatin Itching and Other (See Comments)    Muscle pain  . Rosuvastatin Other (See Comments)    Myalgias (high doses)    Family History  Problem Relation Age of Onset  . Hypertension Mother   . Hypertension Father     Prior to Admission medications   Medication Sig Start Date End Date Taking? Authorizing Provider  amLODipine (NORVASC) 5 MG tablet Take 5 mg by mouth daily. 05/09/16  Yes Historical Provider, MD  calcium citrate-vitamin D (CITRACAL+D) 315-200 MG-UNIT tablet Take 1 tablet by mouth daily.   Yes Historical Provider, MD  dexamethasone (DECADRON) 2  MG tablet Take 2 tablets (4 mg total) by mouth 2 (two) times daily. 10/01/16  Yes Hayden Pedro, PA-C  hydrochlorothiazide (HYDRODIURIL) 25 MG tablet Take 1 tablet (25 mg total) by mouth daily. 10/19/15  Yes Gareth Morgan, MD  HYDROcodone-acetaminophen (NORCO) 5-325 MG tablet Take 1 tablet by mouth every 4 (four) hours as needed for moderate pain. 09/27/16  Yes Hayden Pedro, PA-C  ibuprofen (ADVIL,MOTRIN) 800 MG tablet Take 1 tablet (800 mg total) by mouth every 8 (eight) hours as  needed. 05/28/16  Yes Christopher Lawyer, PA-C  metoprolol succinate (TOPROL-XL) 25 MG 24 hr tablet Take 1 tablet (25 mg total) by mouth daily. 06/11/16  Yes Skeet Latch, MD  ondansetron (ZOFRAN) 8 MG tablet Take 1 tablet (8 mg total) by mouth 2 (two) times daily as needed. nausea 09/27/16  Yes Hayden Pedro, PA-C  traMADol (ULTRAM) 50 MG tablet Take 1 tablet (50 mg total) by mouth every 6 (six) hours as needed for severe pain. 05/28/16  Yes Christopher Lawyer, PA-C  acetaminophen (TYLENOL) 500 MG tablet Take 1,000 mg by mouth every 6 (six) hours as needed for moderate pain.    Historical Provider, MD  allopurinol (ZYLOPRIM) 300 MG tablet Take 150 mg by mouth daily as needed. gout 03/26/16   Historical Provider, MD  ALPRAZolam Duanne Moron) 0.25 MG tablet Take 0.25-50 mg by mouth at bedtime. 05/04/16   Historical Provider, MD  diphenhydrAMINE (BENADRYL) 25 mg capsule Take 50 mg by mouth at bedtime as needed. itching    Historical Provider, MD  emollient (BIAFINE) cream Apply 1 application topically daily. Apply to head and spine  Daily after radiation 10/12/16   Historical Provider, MD  lipase/protease/amylase (CREON) 36000 UNITS CPEP capsule Take 72,000 capsules by mouth 2 (two) times daily at 8 am and 10 pm. Take 2 capsules before breakfast and dinner 08/23/15   Historical Provider, MD  naproxen (EC NAPROSYN) 500 MG EC tablet Take 500 mg by mouth 2 (two) times daily as needed for pain. For gout 02/21/16 02/20/17  Historical Provider, MD  pantoprazole (PROTONIX) 40 MG tablet Take 40 mg by mouth daily as needed (indigestion).    Historical Provider, MD    Physical Exam: Vitals:   10/14/16 0851 10/14/16 0853 10/14/16 1103  BP: (!) 173/104  155/90  Pulse: 72  68  Resp: 20  20  Temp: 97.6 F (36.4 C)  97.6 F (36.4 C)  TempSrc: Oral  Oral  SpO2: 100% 100% 100%      Constitutional: writhing in pain Vitals:   10/14/16 0851 10/14/16 0853 10/14/16 1103  BP: (!) 173/104  155/90  Pulse: 72  68    Resp: 20  20  Temp: 97.6 F (36.4 C)  97.6 F (36.4 C)  TempSrc: Oral  Oral  SpO2: 100% 100% 100%   Eyes: PERRL, lids and conjunctivae normal ENMT: Mucous membranes are moist. Posterior pharynx clear of any exudate or lesions. Neck: normal, supple, no masses, no thyromegaly Respiratory: clear to auscultation bilaterally, no wheezing, no crackles. Normal respiratory effort. No accessory muscle use.  Cardiovascular: Regular rate and rhythm, no murmurs / rubs / gallops. No extremity edema. 2+ pedal pulses. Moderately harsh systolicmurmur. Right subclavian port cath in place Abdomen: no tenderness, no masses palpated. No hepatosplenomegaly. Bowel sounds positive.  Musculoskeletal: no clubbing / cyanosis. No joint deformity upper and lower extremities. Good ROM, no contractures. Normal muscle tone.  Skin: no rashes, lesions, ulcers. No induration Neurologic: CN 2-12 grossly intact. Sensation intact, 4/5  strength lower extremities Psychiatric: [id number]   Labs on Admission: I have personally reviewed following labs and imaging studies  CBC:  Recent Labs Lab 10/14/16 0950 10/14/16 1003  WBC 13.6*  --   NEUTROABS 12.3*  --   HGB 10.1* 11.2*  HCT 29.9* 33.0*  MCV 83.3  --   PLT 151  --    Basic Metabolic Panel:  Recent Labs Lab 10/14/16 0950 10/14/16 1003  NA 130* 131*  K 4.5 4.5  CL 99* 100*  CO2 21*  --   GLUCOSE 124* 125*  BUN 37* 37*  CREATININE 0.84 0.90  CALCIUM 9.8  --    GFR: CrCl cannot be calculated (Unknown ideal weight.). Liver Function Tests:  Recent Labs Lab 10/14/16 0950  AST 37  ALT 50  ALKPHOS 236*  BILITOT 1.3*  PROT 7.0  ALBUMIN 3.7     Radiological Exams on Admission: Dg Chest 2 View  Result Date: 10/14/2016 CLINICAL DATA:  66 year old female with history of generalize weakness following chemotherapy 2 days ago. History of breast and pancreatic cancer. EXAM: CHEST  2 VIEW COMPARISON:  Chest x-ray 10/19/2015. FINDINGS: Small nodular density  projecting over the right lung base new compared to the prior examination, potentially within the right middle or lower lobe (not clearly visualized on the lateral projection). Linear opacities in the left mid to lower lung, similar to prior study, most compatible with areas of mild chronic scarring. No acute consolidative airspace disease. No pleural effusions. No evidence of pulmonary edema. Heart size is normal. Upper mediastinal contours are within normal limits. Right internal jugular single-lumen porta cath with tip terminating at the superior cavoatrial junction. IMPRESSION: 1. No radiographic evidence of acute cardiopulmonary disease. 2. Small nodular density projecting over either the right middle or lower lobe. Given the patient's documented history of two primary malignancies, the possibility of metastatic disease should be considered, and further evaluation with nonemergent noncontrast chest CT is recommended in the near future. Electronically Signed   By: Vinnie Langton M.D.   On: 10/14/2016 10:33   Ct Head Wo Contrast  Result Date: 10/14/2016 CLINICAL DATA:  Leg weakness, side not specified.  On chemotherapy. EXAM: CT HEAD WITHOUT CONTRAST TECHNIQUE: Contiguous axial images were obtained from the base of the skull through the vertex without intravenous contrast. COMPARISON:  05/28/2016 FINDINGS: Brain: No evidence of acute infarction, hemorrhage, hydrocephalus, extra-axial collection or mass lesion/mass effect. Subtle posterior right frontal sulcal density on axial slice 18 is not convincing for pathology on reformats. Vascular: Mild atherosclerotic calcification.  No hyperdense vessel Skull: No visible metastasis. Sinuses/Orbits: Negative IMPRESSION: No acute finding or change from prior. Electronically Signed   By: Monte Fantasia M.D.   On: 10/14/2016 10:18   Ct Thoracic Spine Wo Contrast  Result Date: 10/14/2016 CLINICAL DATA:  RIGHT flank pain, generalized weakness, pancreatic cancer,  breast cancer, had chemotherapy 2 days ago EXAM: CT THORACIC AND LUMBAR SPINE WITHOUT CONTRAST TECHNIQUE: Multidetector CT imaging of the thoracic and lumbar spine was performed without contrast. Multiplanar CT image reconstructions were also generated. COMPARISON:  CT chest abdomen pelvis 05/26/2015 FINDINGS: CT THORACIC SPINE FINDINGS Alignment: Normal Vertebrae: 12 pairs of ribs. Vertebral body heights maintained without fracture or bone destruction. Generalized mild osseous demineralization. Paraspinal and other soft tissues: No abnormal paraspinal soft tissue. Visible mediastinum unremarkable. Coronary arterial calcification noted. Disc levels: Scattered multilevel disc space narrowing and small endplate spurs. No obvious disc herniation or intraspinal mass. CT LUMBAR SPINE FINDINGS Segmentation: 5 non-rib-bearing lumbar  vertebra Alignment: Normal Vertebrae: Sclerosis within the LEFT lateral aspect of the L2 vertebral body question sclerotic versus treated osseous metastasis. Patient had a subtle lucent lesion at this site on the prior CT exam. No additional focal osseous lesions identified. Facet degenerative changes lower lumbar spine. Sclerosis adjacent to RIGHT SI joint appears unchanged. Questionable sclerotic foci in RIGHT ilium image 242 new and LEFT sacrum image 230, more prominent. Paraspinal and other soft tissues: No focal disc herniation or neural compression. Paraspinal soft tissues normal appearance. Normal appendix. Disc levels: Mildly bulging discs at L3-L4, L4-L5, and L5-S1. No definite focal disc herniation. IMPRESSION: CT THORACIC SPINE IMPRESSION Mild scattered degenerative disc disease changes. No acute thoracic spine abnormalities. CT LUMBAR SPINE IMPRESSION Sclerotic lesions within the LEFT lateral aspect of the L2 vertebral body, RIGHT ilium, and LEFT sacrum, question sclerotic versus treated osseous metastases. Facet degenerative changes lower lumbar spine. Electronically Signed   By:  Lavonia Dana M.D.   On: 10/14/2016 10:46   Ct Lumbar Spine Wo Contrast  Result Date: 10/14/2016 CLINICAL DATA:  RIGHT flank pain, generalized weakness, pancreatic cancer, breast cancer, had chemotherapy 2 days ago EXAM: CT THORACIC AND LUMBAR SPINE WITHOUT CONTRAST TECHNIQUE: Multidetector CT imaging of the thoracic and lumbar spine was performed without contrast. Multiplanar CT image reconstructions were also generated. COMPARISON:  CT chest abdomen pelvis 05/26/2015 FINDINGS: CT THORACIC SPINE FINDINGS Alignment: Normal Vertebrae: 12 pairs of ribs. Vertebral body heights maintained without fracture or bone destruction. Generalized mild osseous demineralization. Paraspinal and other soft tissues: No abnormal paraspinal soft tissue. Visible mediastinum unremarkable. Coronary arterial calcification noted. Disc levels: Scattered multilevel disc space narrowing and small endplate spurs. No obvious disc herniation or intraspinal mass. CT LUMBAR SPINE FINDINGS Segmentation: 5 non-rib-bearing lumbar vertebra Alignment: Normal Vertebrae: Sclerosis within the LEFT lateral aspect of the L2 vertebral body question sclerotic versus treated osseous metastasis. Patient had a subtle lucent lesion at this site on the prior CT exam. No additional focal osseous lesions identified. Facet degenerative changes lower lumbar spine. Sclerosis adjacent to RIGHT SI joint appears unchanged. Questionable sclerotic foci in RIGHT ilium image 242 new and LEFT sacrum image 230, more prominent. Paraspinal and other soft tissues: No focal disc herniation or neural compression. Paraspinal soft tissues normal appearance. Normal appendix. Disc levels: Mildly bulging discs at L3-L4, L4-L5, and L5-S1. No definite focal disc herniation. IMPRESSION: CT THORACIC SPINE IMPRESSION Mild scattered degenerative disc disease changes. No acute thoracic spine abnormalities. CT LUMBAR SPINE IMPRESSION Sclerotic lesions within the LEFT lateral aspect of the L2  vertebral body, RIGHT ilium, and LEFT sacrum, question sclerotic versus treated osseous metastases. Facet degenerative changes lower lumbar spine. Electronically Signed   By: Lavonia Dana M.D.   On: 10/14/2016 10:46   Mr Thoracic Spine W Wo Contrast  Result Date: 10/14/2016 CLINICAL DATA:  66 year old female with generalized weakness. Pancreatic and breast cancer status post chemotherapy 2 days ago. Right flank pain. Hepatic metastatic disease. EXAM: MRI THORACIC WITHOUT AND WITH CONTRAST TECHNIQUE: Multiplanar and multiecho pulse sequences of the thoracic spine were obtained without and with intravenous contrast. CONTRAST:  84mL MULTIHANCE GADOBENATE DIMEGLUMINE 529 MG/ML IV SOLN COMPARISON:  CT Abdomen and Pelvis 1006 hours today. CT thoracic and lumbar spine 1013 hours today. Chest CT 05/26/2015. FINDINGS: Limited sagittal imaging of the cervical spine is normal aside from straightening of lordosis. Alignment: Thoracic vertebral height and alignment is stable since 2016. Vertebrae: Thoracic bone marrow signal remains normal. No thoracic marrow edema or evidence of acute  osseous abnormality. There is a sclerotic, largely nonenhancing metastasis in L2 affecting the body, left pedicle and left transverse process (series 5, image 7 and series 12, image 45). Cord: Abnormal plaque-like enhancement along virtually the entire surface of the thoracic spinal cord (series 11 images 6 through 8 and series 12 images 9, 12, and 23. No associated spinal cord enlargement or edema. At the conus medullaris there is evidence of widespread proximal cauda equina nerve root enhancement (series 12, image 40) this is also evident as mild nodularity on the surface of the conus on sagittal T2 (series 10, image 6). The conus is at T12-L1. Paraspinal and other soft tissues: Visualized mediastinum and lung parenchyma is grossly negative. Visualized upper abdominal viscera are stable from the earlier CT Abdomen and Pelvis Disc levels: Age  concordant thoracic disc degeneration. No thoracic spinal stenosis. IMPRESSION: 1. Positive for diffuse spinal leptomeningeal metastatic disease. Involvement of the thoracic spinal cord and visible cauda equina. No associated spinal cord edema. 2. No osseous metastatic disease or acute osseous abnormality in the thoracic spine. 3. L2 vertebral metastasis appears treated, with little to no edema or enhancement. Electronically Signed   By: Genevie Ann M.D.   On: 10/14/2016 13:56   Ct Renal Stone Study  Result Date: 10/14/2016 CLINICAL DATA:  Right flank pain. Evaluate for ureteral calculus. Undergoing chemotherapy for pancreatic and breast cancer. EXAM: CT ABDOMEN AND PELVIS WITHOUT CONTRAST TECHNIQUE: Multidetector CT imaging of the abdomen and pelvis was performed following the standard protocol without IV contrast. COMPARISON:  Abdominopelvic CT 05/26/2015. FINDINGS: Lower chest: There is fluctuating nodularity at the visualized lung bases. A focal irregular 9 mm nodule has developed adjacent to a right lower lobe granuloma on image 14. An irregular nodule previously noted more medially in the right lower lobe has resolved. There are new small solid right lower lobe nodules measuring 5 mm on image 18 and 30. There is also a new sub solid nodule in the left lower lobe on image 7. No significant pleural or pericardial effusion. Probable aortic valvular calcifications again noted. Hepatobiliary: Hepatic evaluation is limited by the lack of intravenous contrast. However, there are multiple hepatic metastases which have enlarged compared with the available prior study. The largest lesions measure 3.7 x 2.3 cm anteriorly in the dome of the right lobe (image number 8) and 5.0 x 3.9 cm peripherally in the inferior right lobe (image 22, previously 3.0 cm maximally). Several other smaller lesions are present. No evidence of gallstones, gallbladder wall thickening or biliary dilatation. Pancreas: No residual pancreatic tail  mass identified. There is no pancreatic ductal dilatation or surrounding inflammation. Spleen: Normal in size without focal abnormality. Adrenals/Urinary Tract: There is new soft tissue thickening and nodularity of both adrenal glands, measuring up to 1.6 x 2.1 cm on the right and 2.5 x 1.4 cm on the left. Low-density renal cysts are grossly stable. There is no evidence of urinary tract calculus or hydronephrosis. The bladder appears unremarkable. Stomach/Bowel: No evidence of bowel wall thickening, distention or surrounding inflammatory change. There is moderate stool throughout the colon. The appendix appears normal. Vascular/Lymphatic: Previously noted prominent lymph nodes in the porta hepatis appear mildly improved. There is no retroperitoneal lymphadenopathy. No significant vascular findings are seen on noncontrast imaging. Reproductive: The uterus and ovaries appear unremarkable. No evidence of adnexal mass. Other: Postsurgical changes in the anterior abdominal wall. No evidence of hernia, ascites or peritoneal nodularity. Musculoskeletal: Interval development of multiple sclerotic lesions consistent with treated metastatic disease.  These are most prominent within the left aspect of the L2 vertebral body, the left sacrum, the right iliac bone and the proximal right femur. Mixed sclerotic and lucent lesion in the intertrochanteric region of the left femur is unchanged. No evidence of pathologic fracture. IMPRESSION: 1. No evidence of urinary tract calculus or hydronephrosis. 2. Progressive multifocal hepatic metastatic disease compared with available prior study from 2016. 3. Multiple sclerotic lesions are noted throughout the bones consistent with treated metastatic disease. 4. Interval development of bilateral adrenal gland thickening and nodularity, potentially metastatic disease as well. 5. Fluctuating nodularity of the lung bases with several new nodules as described. Electronically Signed   By: Richardean Sale M.D.   On: 10/14/2016 10:36     Assessment/Plan Active Problems:   Type 2 diabetes mellitus (HCC)   Hypertension   Tear of medial meniscus of right knee   Malignant neoplasm of head of pancreas (HCC)   Weakness of lower extremity   Constipation   Urinary retention   Cancer associated pain  # Metastatic pancreatic cancer # Neoplastic epidural spinal cord compression  - MRI obtained to eval whether acute cauda acquina or other acute spinal cord process. No spinal cord edema, but that portion of the spinal cord is affected.Spoke w/ Dr.Moody, on callfor rad/onc, plan to likely radiate tomorrow, and requesting we increase decadron dose - decadron 4 mg po q6 hours - rad/onc to see tomorrow  # Urinary retention - possibly secondary to spinal cord involvement as above. Patient did spontaneously void at home this morning. - bladder scan now and q6,if retaining would place foley catheter  # Constipation - dulcolax daily,  and mg citrate x1 for acute constipation  # Pain control - pain currently uncontrolled - start ms contin 15 bid with morphine 4 mg iv q6 prn, up-titrate as needed  # Hypertension - elevated,likely 2/2 pain - continue home meds, monitor   DVT prophylaxis: lovenox  Family Communication: sister Marilynn Latino [phone]  Disposition Plan: TBD3 Consults called: radiation oncolocy, dr. Lisbeth Renshaw Admission status: inpatient   Desma Maxim MD [facility] Pager [phone]  If 7PM-7AM, please contact night-coverage [email or web address] Password [facility]  10/14/2016, 2:14 PM

## 2016-10-14 NOTE — ED Triage Notes (Signed)
Patient is from home. Had chemo 2 days ago and is complaining of generalized weakness.  Denies N/V/D.  Patient took 15 mg of morphine at home about 45 minutes ago.    BP: 177/100 HR:66 R:16 100% on room air   CBG: 161

## 2016-10-14 NOTE — ED Notes (Signed)
Report given to Cindy, RN.

## 2016-10-15 ENCOUNTER — Ambulatory Visit
Admission: RE | Admit: 2016-10-15 | Discharge: 2016-10-15 | Disposition: A | Discharge status: Home / Self Care | Payer: Medicare Other | Source: Ambulatory Visit | Attending: Radiation Oncology | Admitting: Radiation Oncology

## 2016-10-15 ENCOUNTER — Encounter: Payer: Self-pay | Admitting: *Deleted

## 2016-10-15 DIAGNOSIS — D638 Anemia in other chronic diseases classified elsewhere: Secondary | ICD-10-CM | POA: Diagnosis present

## 2016-10-15 DIAGNOSIS — Z515 Encounter for palliative care: Secondary | ICD-10-CM | POA: Diagnosis not present

## 2016-10-15 DIAGNOSIS — I10 Essential (primary) hypertension: Secondary | ICD-10-CM | POA: Diagnosis not present

## 2016-10-15 DIAGNOSIS — E7800 Pure hypercholesterolemia, unspecified: Secondary | ICD-10-CM | POA: Diagnosis present

## 2016-10-15 DIAGNOSIS — I341 Nonrheumatic mitral (valve) prolapse: Secondary | ICD-10-CM | POA: Diagnosis present

## 2016-10-15 DIAGNOSIS — G9520 Unspecified cord compression: Secondary | ICD-10-CM | POA: Diagnosis not present

## 2016-10-15 DIAGNOSIS — G9529 Other cord compression: Secondary | ICD-10-CM | POA: Diagnosis present

## 2016-10-15 DIAGNOSIS — G893 Neoplasm related pain (acute) (chronic): Secondary | ICD-10-CM | POA: Diagnosis not present

## 2016-10-15 DIAGNOSIS — C7800 Secondary malignant neoplasm of unspecified lung: Secondary | ICD-10-CM | POA: Diagnosis present

## 2016-10-15 DIAGNOSIS — R29898 Other symptoms and signs involving the musculoskeletal system: Secondary | ICD-10-CM | POA: Diagnosis not present

## 2016-10-15 DIAGNOSIS — M545 Low back pain: Secondary | ICD-10-CM | POA: Diagnosis present

## 2016-10-15 DIAGNOSIS — E861 Hypovolemia: Secondary | ICD-10-CM | POA: Diagnosis present

## 2016-10-15 DIAGNOSIS — E860 Dehydration: Secondary | ICD-10-CM | POA: Diagnosis present

## 2016-10-15 DIAGNOSIS — Z66 Do not resuscitate: Secondary | ICD-10-CM | POA: Diagnosis not present

## 2016-10-15 DIAGNOSIS — R32 Unspecified urinary incontinence: Secondary | ICD-10-CM | POA: Diagnosis present

## 2016-10-15 DIAGNOSIS — C250 Malignant neoplasm of head of pancreas: Secondary | ICD-10-CM | POA: Diagnosis present

## 2016-10-15 DIAGNOSIS — Z9013 Acquired absence of bilateral breasts and nipples: Secondary | ICD-10-CM | POA: Diagnosis not present

## 2016-10-15 DIAGNOSIS — G4730 Sleep apnea, unspecified: Secondary | ICD-10-CM | POA: Diagnosis present

## 2016-10-15 DIAGNOSIS — Z888 Allergy status to other drugs, medicaments and biological substances status: Secondary | ICD-10-CM | POA: Diagnosis not present

## 2016-10-15 DIAGNOSIS — Z853 Personal history of malignant neoplasm of breast: Secondary | ICD-10-CM | POA: Diagnosis not present

## 2016-10-15 DIAGNOSIS — K5903 Drug induced constipation: Secondary | ICD-10-CM | POA: Diagnosis not present

## 2016-10-15 DIAGNOSIS — C7931 Secondary malignant neoplasm of brain: Secondary | ICD-10-CM | POA: Diagnosis present

## 2016-10-15 DIAGNOSIS — K5900 Constipation, unspecified: Secondary | ICD-10-CM | POA: Diagnosis present

## 2016-10-15 DIAGNOSIS — K219 Gastro-esophageal reflux disease without esophagitis: Secondary | ICD-10-CM | POA: Diagnosis present

## 2016-10-15 DIAGNOSIS — E119 Type 2 diabetes mellitus without complications: Secondary | ICD-10-CM | POA: Diagnosis present

## 2016-10-15 DIAGNOSIS — E871 Hypo-osmolality and hyponatremia: Secondary | ICD-10-CM | POA: Diagnosis present

## 2016-10-15 DIAGNOSIS — C7951 Secondary malignant neoplasm of bone: Secondary | ICD-10-CM | POA: Diagnosis not present

## 2016-10-15 DIAGNOSIS — Z91041 Radiographic dye allergy status: Secondary | ICD-10-CM | POA: Diagnosis not present

## 2016-10-15 DIAGNOSIS — I350 Nonrheumatic aortic (valve) stenosis: Secondary | ICD-10-CM | POA: Diagnosis present

## 2016-10-15 DIAGNOSIS — C787 Secondary malignant neoplasm of liver and intrahepatic bile duct: Secondary | ICD-10-CM | POA: Diagnosis present

## 2016-10-15 DIAGNOSIS — R339 Retention of urine, unspecified: Secondary | ICD-10-CM | POA: Diagnosis not present

## 2016-10-15 DIAGNOSIS — Z794 Long term (current) use of insulin: Secondary | ICD-10-CM | POA: Diagnosis not present

## 2016-10-15 DIAGNOSIS — I119 Hypertensive heart disease without heart failure: Secondary | ICD-10-CM | POA: Diagnosis present

## 2016-10-15 LAB — HEMOGLOBIN A1C
Hgb A1c MFr Bld: 6.2 % — ABNORMAL HIGH (ref 4.8–5.6)
MEAN PLASMA GLUCOSE: 131 mg/dL

## 2016-10-15 LAB — GLUCOSE, CAPILLARY: GLUCOSE-CAPILLARY: 216 mg/dL — AB (ref 65–99)

## 2016-10-15 MED ORDER — POLYETHYLENE GLYCOL 3350 17 G PO PACK
17.0000 g | PACK | Freq: Two times a day (BID) | ORAL | Status: DC
  Administered 2016-10-15 – 2016-10-17 (×5): 17 g via ORAL
  Filled 2016-10-15 (×5): qty 1

## 2016-10-15 MED ORDER — HYDROMORPHONE HCL 2 MG/ML IJ SOLN
1.0000 mg | Freq: Once | INTRAMUSCULAR | Status: AC
  Administered 2016-10-15: 1 mg via INTRAVENOUS
  Filled 2016-10-15: qty 1

## 2016-10-15 MED ORDER — BISACODYL 5 MG PO TBEC
10.0000 mg | DELAYED_RELEASE_TABLET | Freq: Every day | ORAL | Status: DC
  Administered 2016-10-16 – 2016-10-17 (×2): 10 mg via ORAL
  Filled 2016-10-15 (×2): qty 2

## 2016-10-15 MED ORDER — HALOPERIDOL LACTATE 5 MG/ML IJ SOLN
1.0000 mg | Freq: Four times a day (QID) | INTRAMUSCULAR | Status: AC | PRN
  Administered 2016-10-17 (×2): 1 mg via INTRAVENOUS
  Filled 2016-10-15 (×2): qty 1

## 2016-10-15 MED ORDER — HYDRALAZINE HCL 20 MG/ML IJ SOLN
10.0000 mg | INTRAMUSCULAR | Status: DC | PRN
  Administered 2016-10-15 (×2): 10 mg via INTRAVENOUS
  Filled 2016-10-15 (×3): qty 1

## 2016-10-15 MED ORDER — DEXTROSE-NACL 5-0.9 % IV SOLN
INTRAVENOUS | Status: DC
  Administered 2016-10-15: 1000 mL via INTRAVENOUS

## 2016-10-15 MED ORDER — BOOST / RESOURCE BREEZE PO LIQD
1.0000 | Freq: Three times a day (TID) | ORAL | Status: DC
Start: 2016-10-15 — End: 2016-10-19
  Administered 2016-10-15: 1 via ORAL
  Administered 2016-10-15: 237 mL via ORAL
  Administered 2016-10-16 – 2016-10-19 (×4): 1 via ORAL

## 2016-10-15 NOTE — Progress Notes (Signed)
Initial Nutrition Assessment  DOCUMENTATION CODES:   Not applicable  INTERVENTION:   -Boost Breeze po TID, each supplement provides 250 kcal and 9 grams of protein  NUTRITION DIAGNOSIS:   Predicted suboptimal nutrient intake related to poor appetite (unintentional wt loss) as evidenced by percent weight loss, per patient/family report.  GOAL:   Patient will meet greater than or equal to 90% of their needs  MONITOR:   PO intake, Supplement acceptance, Labs, Weight trends, TF tolerance, Skin, I & O's  REASON FOR ASSESSMENT:   Malnutrition Screening Tool    ASSESSMENT:   [name] is a 66 y.o. 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 Started palliative radiation of spinal cord last week in attempt to improve symptoms. Tolerating some po but in severe pain. Takes miralax for stool softening. No unilateral weakness/numbness.  Pt admitted with metastatic pancreatic cancer and neoplastic epidural spinal cord compression.   Pt drowsy at time of visit; answered most of this RD's questions with "yes" or "yeah, why?". She reports that she had a good appetite PTA and denies any weight loss. Last documented wt from [facility] is from 06/22/16 (190#); unable to obtain recent wt from bedscale. Per Care Everywhere records, most recent recorded wt 170# was from 09/01/14/18; used this wt to estimated current needs. Pt has experienced a 10.5% wt loss over a 5 month period based upon most recent wt, which is significant and concerning for time frame.  Per RN notes, pt has been declining PO medications. Noted pt had a sprite and ice pop at bedside. Plan for radiation treatment later  this AM.   Nutrition-focused physical exam deferred due to pain. However, pt did not appear to have signs of fat or muscle depletion.   Palliative care consult pending.   Medications reviewed and include oscal with vitamin D and Creon.   Labs reviewed.  Diet Order:  Diet regular Room service appropriate? Yes; Fluid consistency: Thin  Skin:  Reviewed, no issues  Last BM:  10/07/16  Height:   Ht Readings from Last 1 Encounters:  10/15/16 5\' 6"  (1.676 m)    Weight:   Wt Readings from Last 1 Encounters:  10/15/16 170 lb (77.1 kg)    Ideal Body Weight:  59.1 kg  BMI:  Body mass index is 27.44 kg/m.  Estimated Nutritional Needs:   Kcal:  1800-2000  Protein:  85-100 grams  Fluid:  1.8-2.0 L  EDUCATION NEEDS:   No education needs identified at this time  Stokes Rattigan A. Jimmye Norman, RD, LDN, CDE Pager: [phone] After hours Pager: [phone]

## 2016-10-15 NOTE — Progress Notes (Signed)
Pt is in extreme pain rolling from side to side. States " I can't do this ". Moaning and very restless. She wants to stand, which she can't, her legs won't support. She wants to sit, she can't because it's too painful. The only relief she gets is when she is medicated with Dilaudid 2 mg IV. She becomes sedated and sleeps for a couple hours. She might benefit from a continuous drip.  Family and friends at bedside.

## 2016-10-15 NOTE — Progress Notes (Signed)
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 Presents with worsening back pain and lower extremity weakness.   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 Spinal cord injury related to metastatic pancreatic cancer.  Will continue pain control with as needed   hydromorphone, morphine and scheduled MS contin. Palliative radiation therapy. Palliative care consult. Patient somnolent, will add neuro checks q 4 hours.   2. Constipation and urine retention. Foley catheter in place, will continue bowel regimen with miralax bid and will increase dulcolax to 10 mg.   3. Anemia of chronic disease. Hb at 11 and hct at 33. No signs of bleeding.   4. HTN. Will continue blood pressure control with amlodipine, hctz, metoprolol. Will add hydralazine as needed IV for blood pressure systolic.   5. T2DM. Continue glucose cover with insulin sliding scale, capillary glucose 101, 106., 91. Poor oral intake due to acute illness.   6. Hyponatremia. Na at 131, clinically hypovolemic, will continue hydration with saline, will hold on k supplements to avoid hyperkalemia. Follow on renal panel in am, renal function stable per cr.    DVT prophylaxis: enoxaparin Code Status: full  Family Communication: No family at the bedside  Disposition Plan: home   Consultants:     Procedures:    Antimicrobials:       Subjective: Patient somnolent, slow to respond, partially following commands, no nausea or vomiting,  pain seems to be controlled. Patient recently received analgesia.   Objective: Vitals:   10/14/16 1510 10/14/16 2232 10/15/16 0556 10/15/16 1018  BP: (!) 141/93 (!) 157/91 (!) 156/91   Pulse: 74 72 (!) 58   Resp: 16 18 18    Temp: 98 F (36.7 C) 98 F (36.7 C) 97.8 F (36.6 C)   TempSrc: Oral Oral Oral   SpO2: 100% 100% 96%   Weight:    77.1 kg (170 lb)  Height:    5\' 6"  (1.676 m)    Intake/Output Summary (Last 24 hours) at 10/15/16 1234 Last data filed at 10/15/16 1221  Gross per 24 hour  Intake           301.67 ml  Output             2800 ml  Net         -2498.33 ml   Filed Weights   10/15/16 1018  Weight: 77.1 kg (170 lb)    Examination:  General exam: deconditioned E ENT: mild pallor, oral mucosa dry Respiratory system: No wheezing, rales or rhonchi. Decreased inspiratory effort.  Cardiovascular system: S1 & S2 heard, RRR. No JVD,  rubs, gallops or clicks. No pedal edema. Positive systolic murmur, 3/6, radiated to the carotids.  Gastrointestinal system: Abdomen is nondistended, soft and nontender. No organomegaly or masses felt. Normal bowel sounds heard. Central nervous system: Alert and oriented. No focal neurological deficits. Extremities: Symmetric 5 x 5 power. Skin: No rashes, lesions or ulcers   Data Reviewed: I have personally  reviewed following labs and imaging studies  CBC:  Recent Labs Lab 10/14/16 0950 10/14/16 1003  WBC 13.6*  --   NEUTROABS 12.3*  --   HGB 10.1* 11.2*  HCT 29.9* 33.0*  MCV 83.3  --   PLT 151  --    Basic Metabolic Panel:  Recent Labs Lab 10/14/16 0950 10/14/16 1003  NA 130* 131*  K 4.5 4.5  CL 99* 100*  CO2 21*  --   GLUCOSE 124* 125*  BUN 37* 37*  CREATININE 0.84 0.90  CALCIUM 9.8  --    GFR: Estimated Creatinine Clearance: 65.3 mL/min (by C-G formula based on SCr of 0.9 mg/dL). Liver Function Tests:  Recent Labs Lab 10/14/16 0950  AST 37  ALT 50  ALKPHOS 236*  BILITOT 1.3*  PROT 7.0  ALBUMIN 3.7    No results for input(s): LIPASE, AMYLASE in the last 168 hours. No results for input(s): AMMONIA in the last 168 hours. Coagulation Profile: No results for input(s): INR, PROTIME in the last 168 hours. Cardiac Enzymes: No results for input(s): CKTOTAL, CKMB, CKMBINDEX, TROPONINI in the last 168 hours. BNP (last 3 results) No results for input(s): PROBNP in the last 8760 hours. HbA1C:  Recent Labs  10/14/16 0950  HGBA1C 6.2*   CBG: No results for input(s): GLUCAP in the last 168 hours. Lipid Profile: No results for input(s): CHOL, HDL, LDLCALC, TRIG, CHOLHDL, LDLDIRECT in the last 72 hours. Thyroid Function Tests: No results for input(s): TSH, T4TOTAL, FREET4, T3FREE, THYROIDAB in the last 72 hours. Anemia Panel: No results for input(s): VITAMINB12, FOLATE, FERRITIN, TIBC, IRON, RETICCTPCT in the last 72 hours. Sepsis Labs: No results for input(s): PROCALCITON, LATICACIDVEN in the last 168 hours.  No results found for this or any previous visit (from the past 240 hour(s)).       Radiology Studies: Dg Chest 2 View  Result Date: 10/14/2016 CLINICAL DATA:  66 year old female with history of generalize weakness following chemotherapy 2 days ago. History of breast and pancreatic cancer. EXAM: CHEST  2 VIEW COMPARISON:  Chest x-ray 10/19/2015. FINDINGS: Small nodular density projecting over the right lung base new compared to the prior examination, potentially within the right middle or lower lobe (not clearly visualized on the lateral projection). Linear opacities in the left mid to lower lung, similar to prior study, most compatible with areas of mild chronic scarring. No acute consolidative airspace disease. No pleural effusions. No evidence of pulmonary edema. Heart size is normal. Upper mediastinal contours are within normal limits. Right internal jugular single-lumen porta cath with tip terminating at the superior cavoatrial junction. IMPRESSION: 1. No radiographic evidence of  acute cardiopulmonary disease. 2. Small nodular density projecting over either the right middle or lower lobe. Given the patient's documented history of two primary malignancies, the possibility of metastatic disease should be considered, and further evaluation with nonemergent noncontrast chest CT is recommended in the near future. Electronically Signed   By: Vinnie Langton M.D.   On: 10/14/2016 10:33   Ct Head Wo Contrast  Result Date: 10/14/2016 CLINICAL DATA:  Leg weakness, side not specified.  On chemotherapy. EXAM: CT HEAD WITHOUT CONTRAST TECHNIQUE: Contiguous axial images were obtained from the base of the skull through the vertex without intravenous contrast. COMPARISON:  05/28/2016 FINDINGS: Brain: No evidence of acute infarction, hemorrhage, hydrocephalus, extra-axial collection or mass lesion/mass effect. Subtle posterior right frontal sulcal density on axial slice 18 is not convincing for pathology on reformats. Vascular: Mild atherosclerotic calcification.  No hyperdense  vessel Skull: No visible metastasis. Sinuses/Orbits: Negative IMPRESSION: No acute finding or change from prior. Electronically Signed   By: Monte Fantasia M.D.   On: 10/14/2016 10:18   Ct Thoracic Spine Wo Contrast  Result Date: 10/14/2016 CLINICAL DATA:  RIGHT flank pain, generalized weakness, pancreatic cancer, breast cancer, had chemotherapy 2 days ago EXAM: CT THORACIC AND LUMBAR SPINE WITHOUT CONTRAST TECHNIQUE: Multidetector CT imaging of the thoracic and lumbar spine was performed without contrast. Multiplanar CT image reconstructions were also generated. COMPARISON:  CT chest abdomen pelvis 05/26/2015 FINDINGS: CT THORACIC SPINE FINDINGS Alignment: Normal Vertebrae: 12 pairs of ribs. Vertebral body heights maintained without fracture or bone destruction. Generalized mild osseous demineralization. Paraspinal and other soft tissues: No abnormal paraspinal soft tissue. Visible mediastinum unremarkable. Coronary  arterial calcification noted. Disc levels: Scattered multilevel disc space narrowing and small endplate spurs. No obvious disc herniation or intraspinal mass. CT LUMBAR SPINE FINDINGS Segmentation: 5 non-rib-bearing lumbar vertebra Alignment: Normal Vertebrae: Sclerosis within the LEFT lateral aspect of the L2 vertebral body question sclerotic versus treated osseous metastasis. Patient had a subtle lucent lesion at this site on the prior CT exam. No additional focal osseous lesions identified. Facet degenerative changes lower lumbar spine. Sclerosis adjacent to RIGHT SI joint appears unchanged. Questionable sclerotic foci in RIGHT ilium image 242 new and LEFT sacrum image 230, more prominent. Paraspinal and other soft tissues: No focal disc herniation or neural compression. Paraspinal soft tissues normal appearance. Normal appendix. Disc levels: Mildly bulging discs at L3-L4, L4-L5, and L5-S1. No definite focal disc herniation. IMPRESSION: CT THORACIC SPINE IMPRESSION Mild scattered degenerative disc disease changes. No acute thoracic spine abnormalities. CT LUMBAR SPINE IMPRESSION Sclerotic lesions within the LEFT lateral aspect of the L2 vertebral body, RIGHT ilium, and LEFT sacrum, question sclerotic versus treated osseous metastases. Facet degenerative changes lower lumbar spine. Electronically Signed   By: Lavonia Dana M.D.   On: 10/14/2016 10:46   Ct Lumbar Spine Wo Contrast  Result Date: 10/14/2016 CLINICAL DATA:  RIGHT flank pain, generalized weakness, pancreatic cancer, breast cancer, had chemotherapy 2 days ago EXAM: CT THORACIC AND LUMBAR SPINE WITHOUT CONTRAST TECHNIQUE: Multidetector CT imaging of the thoracic and lumbar spine was performed without contrast. Multiplanar CT image reconstructions were also generated. COMPARISON:  CT chest abdomen pelvis 05/26/2015 FINDINGS: CT THORACIC SPINE FINDINGS Alignment: Normal Vertebrae: 12 pairs of ribs. Vertebral body heights maintained without fracture or  bone destruction. Generalized mild osseous demineralization. Paraspinal and other soft tissues: No abnormal paraspinal soft tissue. Visible mediastinum unremarkable. Coronary arterial calcification noted. Disc levels: Scattered multilevel disc space narrowing and small endplate spurs. No obvious disc herniation or intraspinal mass. CT LUMBAR SPINE FINDINGS Segmentation: 5 non-rib-bearing lumbar vertebra Alignment: Normal Vertebrae: Sclerosis within the LEFT lateral aspect of the L2 vertebral body question sclerotic versus treated osseous metastasis. Patient had a subtle lucent lesion at this site on the prior CT exam. No additional focal osseous lesions identified. Facet degenerative changes lower lumbar spine. Sclerosis adjacent to RIGHT SI joint appears unchanged. Questionable sclerotic foci in RIGHT ilium image 242 new and LEFT sacrum image 230, more prominent. Paraspinal and other soft tissues: No focal disc herniation or neural compression. Paraspinal soft tissues normal appearance. Normal appendix. Disc levels: Mildly bulging discs at L3-L4, L4-L5, and L5-S1. No definite focal disc herniation. IMPRESSION: CT THORACIC SPINE IMPRESSION Mild scattered degenerative disc disease changes. No acute thoracic spine abnormalities. CT LUMBAR SPINE IMPRESSION Sclerotic lesions within the LEFT lateral aspect of the L2 vertebral body,  RIGHT ilium, and LEFT sacrum, question sclerotic versus treated osseous metastases. Facet degenerative changes lower lumbar spine. Electronically Signed   By: Lavonia Dana M.D.   On: 10/14/2016 10:46   Mr Thoracic Spine W Wo Contrast  Result Date: 10/14/2016 CLINICAL DATA:  66 year old female with generalized weakness. Pancreatic and breast cancer status post chemotherapy 2 days ago. Right flank pain. Hepatic metastatic disease. EXAM: MRI THORACIC WITHOUT AND WITH CONTRAST TECHNIQUE: Multiplanar and multiecho pulse sequences of the thoracic spine were obtained without and with intravenous  contrast. CONTRAST:  7mL MULTIHANCE GADOBENATE DIMEGLUMINE 529 MG/ML IV SOLN COMPARISON:  CT Abdomen and Pelvis 1006 hours today. CT thoracic and lumbar spine 1013 hours today. Chest CT 05/26/2015. FINDINGS: Limited sagittal imaging of the cervical spine is normal aside from straightening of lordosis. Alignment: Thoracic vertebral height and alignment is stable since 2016. Vertebrae: Thoracic bone marrow signal remains normal. No thoracic marrow edema or evidence of acute osseous abnormality. There is a sclerotic, largely nonenhancing metastasis in L2 affecting the body, left pedicle and left transverse process (series 5, image 7 and series 12, image 45). Cord: Abnormal plaque-like enhancement along virtually the entire surface of the thoracic spinal cord (series 11 images 6 through 8 and series 12 images 9, 12, and 23. No associated spinal cord enlargement or edema. At the conus medullaris there is evidence of widespread proximal cauda equina nerve root enhancement (series 12, image 40) this is also evident as mild nodularity on the surface of the conus on sagittal T2 (series 10, image 6). The conus is at T12-L1. Paraspinal and other soft tissues: Visualized mediastinum and lung parenchyma is grossly negative. Visualized upper abdominal viscera are stable from the earlier CT Abdomen and Pelvis Disc levels: Age concordant thoracic disc degeneration. No thoracic spinal stenosis. IMPRESSION: 1. Positive for diffuse spinal leptomeningeal metastatic disease. Involvement of the thoracic spinal cord and visible cauda equina. No associated spinal cord edema. 2. No osseous metastatic disease or acute osseous abnormality in the thoracic spine. 3. L2 vertebral metastasis appears treated, with little to no edema or enhancement. Electronically Signed   By: Genevie Ann M.D.   On: 10/14/2016 13:56   Ct Renal Stone Study  Result Date: 10/14/2016 CLINICAL DATA:  Right flank pain. Evaluate for ureteral calculus. Undergoing  chemotherapy for pancreatic and breast cancer. EXAM: CT ABDOMEN AND PELVIS WITHOUT CONTRAST TECHNIQUE: Multidetector CT imaging of the abdomen and pelvis was performed following the standard protocol without IV contrast. COMPARISON:  Abdominopelvic CT 05/26/2015. FINDINGS: Lower chest: There is fluctuating nodularity at the visualized lung bases. A focal irregular 9 mm nodule has developed adjacent to a right lower lobe granuloma on image 14. An irregular nodule previously noted more medially in the right lower lobe has resolved. There are new small solid right lower lobe nodules measuring 5 mm on image 18 and 30. There is also a new sub solid nodule in the left lower lobe on image 7. No significant pleural or pericardial effusion. Probable aortic valvular calcifications again noted. Hepatobiliary: Hepatic evaluation is limited by the lack of intravenous contrast. However, there are multiple hepatic metastases which have enlarged compared with the available prior study. The largest lesions measure 3.7 x 2.3 cm anteriorly in the dome of the right lobe (image number 8) and 5.0 x 3.9 cm peripherally in the inferior right lobe (image 22, previously 3.0 cm maximally). Several other smaller lesions are present. No evidence of gallstones, gallbladder wall thickening or biliary dilatation. Pancreas: No residual pancreatic  tail mass identified. There is no pancreatic ductal dilatation or surrounding inflammation. Spleen: Normal in size without focal abnormality. Adrenals/Urinary Tract: There is new soft tissue thickening and nodularity of both adrenal glands, measuring up to 1.6 x 2.1 cm on the right and 2.5 x 1.4 cm on the left. Low-density renal cysts are grossly stable. There is no evidence of urinary tract calculus or hydronephrosis. The bladder appears unremarkable. Stomach/Bowel: No evidence of bowel wall thickening, distention or surrounding inflammatory change. There is moderate stool throughout the colon. The  appendix appears normal. Vascular/Lymphatic: Previously noted prominent lymph nodes in the porta hepatis appear mildly improved. There is no retroperitoneal lymphadenopathy. No significant vascular findings are seen on noncontrast imaging. Reproductive: The uterus and ovaries appear unremarkable. No evidence of adnexal mass. Other: Postsurgical changes in the anterior abdominal wall. No evidence of hernia, ascites or peritoneal nodularity. Musculoskeletal: Interval development of multiple sclerotic lesions consistent with treated metastatic disease. These are most prominent within the left aspect of the L2 vertebral body, the left sacrum, the right iliac bone and the proximal right femur. Mixed sclerotic and lucent lesion in the intertrochanteric region of the left femur is unchanged. No evidence of pathologic fracture. IMPRESSION: 1. No evidence of urinary tract calculus or hydronephrosis. 2. Progressive multifocal hepatic metastatic disease compared with available prior study from 2016. 3. Multiple sclerotic lesions are noted throughout the bones consistent with treated metastatic disease. 4. Interval development of bilateral adrenal gland thickening and nodularity, potentially metastatic disease as well. 5. Fluctuating nodularity of the lung bases with several new nodules as described. Electronically Signed   By: Richardean Sale M.D.   On: 10/14/2016 10:36        Scheduled Meds: . amLODipine  5 mg Oral Daily  . bisacodyl  5 mg Oral Daily  . calcium-vitamin D  1 tablet Oral Q breakfast  . dexamethasone  4 mg Intravenous Q6H  . enoxaparin (LOVENOX) injection  40 mg Subcutaneous Q24H  . feeding supplement  1 Container Oral TID BM  . hydrochlorothiazide  25 mg Oral Daily  . lipase/protease/amylase  72,000 Units Oral BID AC  . metoprolol succinate  25 mg Oral Daily  . morphine  15 mg Oral Q12H  . SONAFINE  1 application Topical Daily   Continuous Infusions: . dextrose 5 % and 0.9 % NaCl with KCl  20 mEq/L 1,000 mL (10/15/16 1044)     LOS: 0 days      Hodari Chuba Gerome Apley, MD [facility] Pager [phone]  If 7PM-7AM, please contact night-coverage [email or web address] Password [facility] 10/15/2016, 12:34 PM

## 2016-10-15 NOTE — Progress Notes (Signed)
Thank you for consulting the Palliative Medicine Team at [facility] to meet your patient's and family's needs.   The reason that you asked [place] to see your patient is  For Establishing GOCs and emotional support  I spoke to her son Louie Boston and await callback for a time to meet.  Patient is unable to make her own decisions at this point in time.  Wadie Lessen NP # [phone]  No charge

## 2016-10-15 NOTE — Progress Notes (Signed)
Called [address] [place] for patient 62, spoke with RN Jenny Reichmann, pati9nt is sleeping at present, asked if she could medixccate for pain before patient's treatment at 1120, transporter should be there by 1100am to pick her up, Jenny Reichmann RN sated she would,thanked RN,called [place]#4,spoker with [place] therapist and gave updated  Status 8:30 AM

## 2016-10-15 NOTE — Progress Notes (Signed)
[facility] Therapy Treatment Record Phone [phone]   Radiation Therapy was administered to [place] on: 10/15/2016  12:56 PM and was treatment # 3 out of a planned course of 14 treatments.  Radiation Treatment  1). Beam photons with 6-10 energy  2). Brachytherapy None  3). Stereotactic Radiosurgery None  4). Other Radiation None     Lasnicki,Saachi Zale, RT (T)

## 2016-10-15 NOTE — Care Management Obs Status (Signed)
MEDICARE OBSERVATION STATUS NOTIFICATION   Patient Details  Name: [name] MRN: [id number] Date of Birth: 1951/02/21   Medicare Observation Status Notification Given:  Other (see comment) Unable to sign due to recent IR and medications.  Patient very lethargic.    Leeroy Cha, RN 10/15/2016, 2:23 PM

## 2016-10-16 ENCOUNTER — Telehealth: Payer: Self-pay | Admitting: *Deleted

## 2016-10-16 ENCOUNTER — Ambulatory Visit
Admission: RE | Admit: 2016-10-16 | Discharge: 2016-10-16 | Disposition: A | Discharge status: Home / Self Care | Payer: Medicare Other | Source: Ambulatory Visit | Attending: Radiation Oncology | Admitting: Radiation Oncology

## 2016-10-16 VITALS — BP 194/101 | HR 72 | Temp 98.1°F | Resp 20

## 2016-10-16 DIAGNOSIS — C250 Malignant neoplasm of head of pancreas: Secondary | ICD-10-CM

## 2016-10-16 LAB — CBC WITH DIFFERENTIAL/PLATELET
BASOS PCT: 0 %
Basophils Absolute: 0 10*3/uL (ref 0.0–0.1)
EOS ABS: 0 10*3/uL (ref 0.0–0.7)
Eosinophils Relative: 0 %
HEMATOCRIT: 28.1 % — AB (ref 36.0–46.0)
HEMOGLOBIN: 9.7 g/dL — AB (ref 12.0–15.0)
LYMPHS ABS: 0.1 10*3/uL — AB (ref 0.7–4.0)
Lymphocytes Relative: 1 %
MCH: 29.6 pg (ref 26.0–34.0)
MCHC: 34.5 g/dL (ref 30.0–36.0)
MCV: 85.7 fL (ref 78.0–100.0)
MONO ABS: 0.2 10*3/uL (ref 0.1–1.0)
MONOS PCT: 1 %
Neutro Abs: 15.8 10*3/uL — ABNORMAL HIGH (ref 1.7–7.7)
Neutrophils Relative %: 98 %
Platelets: 128 10*3/uL — ABNORMAL LOW (ref 150–400)
RBC: 3.28 MIL/uL — ABNORMAL LOW (ref 3.87–5.11)
RDW: 15.8 % — AB (ref 11.5–15.5)
WBC: 16.1 10*3/uL — ABNORMAL HIGH (ref 4.0–10.5)

## 2016-10-16 LAB — BASIC METABOLIC PANEL
Anion gap: 7 (ref 5–15)
BUN: 22 mg/dL — AB (ref 6–20)
CALCIUM: 9 mg/dL (ref 8.9–10.3)
CHLORIDE: 105 mmol/L (ref 101–111)
CO2: 22 mmol/L (ref 22–32)
CREATININE: 0.75 mg/dL (ref 0.44–1.00)
GFR calc non Af Amer: >60 mL/min (ref >60)
Glucose, Bld: 237 mg/dL — ABNORMAL HIGH (ref 65–99)
Potassium: 4.9 mmol/L (ref 3.5–5.1)
SODIUM: 134 mmol/L — AB (ref 135–145)

## 2016-10-16 LAB — GLUCOSE, CAPILLARY: Glucose-Capillary: 217 mg/dL — ABNORMAL HIGH (ref 65–99)

## 2016-10-16 MED ORDER — MORPHINE SULFATE 4 MG/ML IJ SOLN
2.0000 mg | Freq: Once | INTRAMUSCULAR | Status: AC
  Administered 2016-10-16: 2 mg via INTRAMUSCULAR
  Filled 2016-10-16: qty 1

## 2016-10-16 NOTE — Progress Notes (Signed)
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 Presents with worsening back pain and lower extremity weakness. On opiates and radiation therapy, follow on physical therapy for further disposition.     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 Spinal cord injury related to metastatic pancreatic cancer.  Continue pain control with hydromorphone as needed plus morphine and scheduled MS contin. Continue palliative radiation therapy. Follow on palliative care consult. Today patient looks more calm and alert, she still reports persistent pain.   2. Constipation and urine retention. Will keep Foley catheter in place, continue bowel regimen with miralax bid and will increase dulcolax to 10 mg. May attempt voiding trial in am.   3. Anemia of chronic disease. Hb at 9.7. Likely drop due to hemodilution. Will continue close follow up of cell count. Leukocytosis, probably reactive follow cell count in am, no signs of infection.   4. HTN. Blood pressure control with amlodipine, hctz, metoprolol, plus hydralazine as needed IV for blood pressure systolic greater than 99991111.   5. T2DM. Continue glucose cover with insulin sliding scale, capillary glucose 91-216-217. Improve oral intake, will hold on IV fluids.   6. Hyponatremia. Na at 134, clinically hypovolemic, will hold on IV fluids. Follow renal panel in am.   DVT prophylaxis: enoxaparin Code Status: full  Family Communication: No family at the  bedside  Disposition Plan: home   Consultants:     Procedures:    Antimicrobials:       Subjective: Patient with no chest pain or dyspnea, back pain still persistent and associated with ambulatory dysfunction. Improved with IV opioids.   Objective: Vitals:   10/16/16 0414 10/16/16 0541 10/16/16 0844 10/16/16 1400  BP: (!) 175/106 (!) 166/111 (!) 172/106 (!) 165/102  Pulse: 87 88 97 76  Resp: 20 20 20 20   Temp: 97.8 F (36.6 C)  97.6 F (36.4 C) 98.2 F (36.8 C)  TempSrc: Axillary  Oral Oral  SpO2: 100% 99% 100% 100%  Weight:      Height:        Intake/Output Summary (Last 24 hours) at 10/16/16 1448 Last data filed at 10/16/16 1409  Gross per 24 hour  Intake                0 ml  Output             1875 ml  Net            -1875 ml   Filed Weights   10/15/16 1018  Weight: 77.1 kg (170 lb)    Examination:  General exam: deconditioned E ENT: no pallor or icterus, oral mucosa moist.  Respiratory system: Clear to auscultation. Respiratory effort normal. No wheezing, rales or rhonchi Cardiovascular system: S1 & S2 heard, RRR. No JVD, murmurs, rubs, gallops or clicks. No pedal edema. Gastrointestinal system: Abdomen is nondistended, soft and nontender. No organomegaly or masses felt. Normal bowel sounds heard. Central nervous system: Alert and oriented.  No focal neurological deficits. Extremities: Symmetric 5 x 5 power. Skin: No rashes, lesions or ulcers     Data Reviewed: I have personally reviewed following labs and imaging studies  CBC:  Recent Labs Lab 10/14/16 0950 10/14/16 1003 10/16/16 0455  WBC 13.6*  --  16.1*  NEUTROABS 12.3*  --  15.8*  HGB 10.1* 11.2* 9.7*  HCT 29.9* 33.0* 28.1*  MCV 83.3  --  85.7  PLT 151  --  0000000*   Basic Metabolic Panel:  Recent Labs Lab 10/14/16 0950 10/14/16 1003 10/16/16 0455  NA 130* 131* 134*  K 4.5 4.5 4.9  CL 99* 100* 105  CO2 21*  --  22  GLUCOSE 124* 125* 237*  BUN 37* 37* 22*  CREATININE  0.84 0.90 0.75  CALCIUM 9.8  --  9.0   GFR: Estimated Creatinine Clearance: 73.5 mL/min (by C-G formula based on SCr of 0.75 mg/dL). Liver Function Tests:  Recent Labs Lab 10/14/16 0950  AST 37  ALT 50  ALKPHOS 236*  BILITOT 1.3*  PROT 7.0  ALBUMIN 3.7   No results for input(s): LIPASE, AMYLASE in the last 168 hours. No results for input(s): AMMONIA in the last 168 hours. Coagulation Profile: No results for input(s): INR, PROTIME in the last 168 hours. Cardiac Enzymes: No results for input(s): CKTOTAL, CKMB, CKMBINDEX, TROPONINI in the last 168 hours. BNP (last 3 results) No results for input(s): PROBNP in the last 8760 hours. HbA1C:  Recent Labs  10/14/16 0950  HGBA1C 6.2*   CBG:  Recent Labs Lab 10/15/16 2030 10/16/16 0825  GLUCAP 216* 217*   Lipid Profile: No results for input(s): CHOL, HDL, LDLCALC, TRIG, CHOLHDL, LDLDIRECT in the last 72 hours. Thyroid Function Tests: No results for input(s): TSH, T4TOTAL, FREET4, T3FREE, THYROIDAB in the last 72 hours. Anemia Panel: No results for input(s): VITAMINB12, FOLATE, FERRITIN, TIBC, IRON, RETICCTPCT in the last 72 hours. Sepsis Labs: No results for input(s): PROCALCITON, LATICACIDVEN in the last 168 hours.  No results found for this or any previous visit (from the past 240 hour(s)).       Radiology Studies: No results found.      Scheduled Meds: . amLODipine  5 mg Oral Daily  . bisacodyl  10 mg Oral Daily  . calcium-vitamin D  1 tablet Oral Q breakfast  . dexamethasone  4 mg Intravenous Q6H  . enoxaparin (LOVENOX) injection  40 mg Subcutaneous Q24H  . feeding supplement  1 Container Oral TID BM  . hydrochlorothiazide  25 mg Oral Daily  . lipase/protease/amylase  72,000 Units Oral BID AC  . metoprolol succinate  25 mg Oral Daily  . morphine  15 mg Oral Q12H  . polyethylene glycol  17 g Oral BID  . SONAFINE  1 application Topical Daily   Continuous Infusions: . dextrose 5 % and 0.9% NaCl 1,000  mL (10/15/16 1404)     LOS: 1 day       Mauricio Gerome Apley, MD [facility] Pager [phone]  If 7PM-7AM, please contact night-coverage [email or web address] Password [facility] 10/16/2016, 2:48 PM

## 2016-10-16 NOTE — Telephone Encounter (Signed)
[name], [place], she had given patient 4mg  Morphine and ms contin at 9:10am, pr Dr. Lisbeth Renshaw  Patient's pain is in a lot of pain at [facility] , checked patient's vitals first,  98.1, 194/101.,72.20,. 100% room air sat,  At 9:55am gave 2mg  IM morphine right deltoid after talking with patient, pain 9/10 on left side stated by patient, , rechecked pain level at 10:15am, patient some what calmer, no grimacing, , called Festus Holts RN gave updated status 10:17 AM

## 2016-10-16 NOTE — Consult Note (Signed)
Consultation Note Date: 10/16/2016   Patient Name: [name]  DOB: 1951/01/12  MRN: [id number]  Age / Sex: 66 y.o., female  PCP: Josetta Huddle, MD Referring Physician: Jimmy Picket Arrien, *  Reason for Consultation: Establishing goals of care and Psychosocial/spiritual support  HPI/Patient Profile: 66 y.o. 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  Patient is treated at [facility] oncology  Presenting with a few weeks progressively worsening pain (mainly in the back and hips), weakness (whole body, but particuularly lower extremities, with worsening instability), 7 days constipation, and a few daysof difficulty urinating (last urinated this morning). Started palliative radiation of spinal cord last week in attempt to improve symptoms.   Significant symptom burden/ severe pain and cognitive changes  Patient and family face treatment options and advanced directive decisions and anticipatory care needs.   Clinical Assessment and Goals of Care:  This NP Wadie Lessen reviewed medical records, received report from team, assessed the patient and then meet at the patient's bedside along with her son [name] [id number]  to discuss diagnosis, prognosis, GOC, EOL wishes disposition and options.  I spoke to her friend Marlow Baars yesterday, they worked together as RNs at [employer], she offered her insight into the patient's EOL wishes   A detailed discussion was had today regarding advanced directives.  Concepts specific to code status, artifical feeding and hydration, continued IV antibiotics and rehospitalization was had.  The difference between a aggressive medical intervention path  and a palliative comfort care path for this patient at this time was  had.  Values and goals of care important to patient and family were attempted to be elicited.   Concept of Hospice and Palliative Care were discussed  Natural trajectory and expectations at EOL were discussed.  Questions and concerns addressed.   Family encouraged to call with questions or concerns.  PMT will continue to support holistically.   NEXT OF KIN   Family encouraged to continue conversation and make patient centered decisions with Ms Gentry-Legrands values as the priority    SUMMARY OF RECOMMENDATIONS    Code Status/Advance Care Planning:  DNR  - documented today and supported by her son   Palliative Prophylaxis:   Frequent Pain Assessment and Oral Care  Additional Recommendations (Limitations, Scope, Preferences):  Full Scope Treatment  Psycho-social/Spiritual:   Desire for further Chaplaincy support:yes  Additional Recommendations: Education on Hospice  Prognosis:   < 6 weeks  Discharge Planning: To Be Determined      Primary Diagnoses: Present on Admission: . Weakness of lower extremity . Hypertension . Malignant neoplasm of head of pancreas ([place]) . Tear of medial meniscus of right knee   I have reviewed the medical record, interviewed the patient and family, and examined the patient. The following aspects are pertinent.  Past Medical History:  Diagnosis Date  . Aortic stenosis   . Arthritis    knee  .  Breast cancer ([place]) 11/25/2006   left breast  . Cancer ([place]) 06/06/2015   liver   . Diabetes mellitus   . GERD (gastroesophageal reflux disease)   . Headache(784.0)    hx migraines , none in past year  . Heart murmur   . History of kidney stones    passed  2  . History of migraines   . HX: breast cancer   . Hypercholesterolemia   . Hypertension   . Hypertensive cardiovascular disease   . MVP (mitral valve prolapse)   . Pancreatic cancer ([place])   . Sleep apnea    Social History   Social History  . Marital status: Divorced     Spouse name: N/A  . Number of children: N/A  . Years of education: N/A   Social History Main Topics  . Smoking status: Never Smoker  . Smokeless tobacco: Never Used  . Alcohol use No  . Drug use: No  . Sexual activity: Not Asked   Other Topics Concern  . None   Social History Narrative  . None   Family History  Problem Relation Age of Onset  . Hypertension Mother   . Hypertension Father    Scheduled Meds: . amLODipine  5 mg Oral Daily  . bisacodyl  10 mg Oral Daily  . calcium-vitamin D  1 tablet Oral Q breakfast  . dexamethasone  4 mg Intravenous Q6H  . enoxaparin (LOVENOX) injection  40 mg Subcutaneous Q24H  . feeding supplement  1 Container Oral TID BM  . hydrochlorothiazide  25 mg Oral Daily  . lipase/protease/amylase  72,000 Units Oral BID AC  . metoprolol succinate  25 mg Oral Daily  . morphine  15 mg Oral Q12H  . polyethylene glycol  17 g Oral BID  . SONAFINE  1 application Topical Daily   Continuous Infusions: . dextrose 5 % and 0.9% NaCl 1,000 mL (10/15/16 1404)   PRN Meds:.haloperidol lactate, hydrALAZINE, HYDROmorphone (DILAUDID) injection, magnesium citrate, morphine injection, ondansetron, pantoprazole Medications Prior to Admission:  Prior to Admission medications   Medication Sig Start Date End Date Taking? Authorizing Provider  amLODipine (NORVASC) 5 MG tablet Take 5 mg by mouth daily. 05/09/16  Yes Historical Provider, MD  calcium citrate-vitamin D (CITRACAL+D) 315-200 MG-UNIT tablet Take 1 tablet by mouth daily.   Yes Historical Provider, MD  dexamethasone (DECADRON) 2 MG tablet Take 2 tablets (4 mg total) by mouth 2 (two) times daily. 10/01/16  Yes Hayden Pedro, PA-C  emollient (BIAFINE) cream Apply 1 application topically daily. Apply to head and spine  Daily after radiation 10/12/16  Yes Historical Provider, MD  hydrochlorothiazide (HYDRODIURIL) 25 MG tablet Take 1 tablet (25 mg total) by mouth daily. 10/19/15  Yes Gareth Morgan, MD    HYDROcodone-acetaminophen (NORCO) 5-325 MG tablet Take 1 tablet by mouth every 4 (four) hours as needed for moderate pain. 09/27/16  Yes Hayden Pedro, PA-C  ibuprofen (ADVIL,MOTRIN) 800 MG tablet Take 1 tablet (800 mg total) by mouth every 8 (eight) hours as needed. 05/28/16  Yes Dalia Heading, PA-C  lipase/protease/amylase (CREON) 36000 UNITS CPEP capsule Take 72,000 capsules by mouth 2 (two) times daily at 8 am and 10 pm. Take 2 capsules before breakfast and dinner 08/23/15  Yes Historical Provider, MD  Magnesium Oxide 400 (240 Mg) MG TABS Take 1 tablet (400 mg total) by mouth once daily. 09/03/16  Yes Historical Provider, MD  metoprolol succinate (TOPROL-XL) 25 MG 24 hr tablet Take 1 tablet (25 mg total)  by mouth daily. 06/11/16  Yes Skeet Latch, MD  morphine (MSIR) 15 MG tablet Take 15 mg by mouth every 4 (four) hours as needed. for pain 09/20/16  Yes Historical Provider, MD  ondansetron (ZOFRAN) 8 MG tablet Take 1 tablet (8 mg total) by mouth 2 (two) times daily as needed. nausea 09/27/16  Yes Hayden Pedro, PA-C  pantoprazole (PROTONIX) 40 MG tablet Take 40 mg by mouth daily as needed (indigestion).   Yes Historical Provider, MD  traMADol (ULTRAM) 50 MG tablet Take 1 tablet (50 mg total) by mouth every 6 (six) hours as needed for severe pain. Patient not taking: Reported on 10/14/2016 05/28/16   Dalia Heading, PA-C   Allergies  Allergen Reactions  . Shellfish Allergy Anaphylaxis  . Prednisone Other (See Comments)  . Iodine Other (See Comments)    unknown  . Latex Rash  . Penicillins Hives and Itching    Has patient had a PCN reaction causing immediate rash, facial/tongue/throat swelling, SOB or lightheadedness with hypotension: yes Has patient had a PCN reaction causing severe rash involving mucus membranes or skin necrosis: no Has patient had a PCN reaction that required hospitalization : ed visit Has patient had a PCN reaction occurring within the last 10 years:  yes If all of the above answers are "NO", then may proceed with Cephalosporin use.   . Pravachol Itching and Other (See Comments)    Muscle pain  . Pravastatin Itching and Other (See Comments)    Muscle pain  . Rosuvastatin Other (See Comments)    Myalgias (high doses)   Review of Systems  Unable to perform ROS: Acuity of condition    Physical Exam  Constitutional: She appears well-developed. She appears ill.  c  Cardiovascular: Normal rate, regular rhythm and normal heart sounds.   Pulmonary/Chest: Effort normal and breath sounds normal.  Skin: Skin is warm and dry.    Vital Signs: BP (!) 172/106 (BP Location: Left Arm)   Pulse 97   Temp 97.6 F (36.4 C) (Oral)   Resp 20   Ht 5\' 6"  (1.676 m) Comment: 09/27/16  Wt 77.1 kg (170 lb) Comment: from 06/22/16  SpO2 100%   BMI 27.44 kg/m  Pain Assessment: Faces POSS *See Group Information*: 2-Acceptable,Slightly drowsy, easily aroused Pain Score: 0-No pain   SpO2: SpO2: 100 % O2 Device:SpO2: 100 % O2 Flow Rate: .   IO: Intake/output summary:  Intake/Output Summary (Last 24 hours) at 10/16/16 1039 Last data filed at 10/16/16 0845  Gross per 24 hour  Intake                0 ml  Output             1975 ml  Net            -1975 ml    LBM: Last BM Date: 10/07/16 Baseline Weight: Weight:  (unable to stand at this time) Most recent weight: Weight: 77.1 kg (170 lb) (from 06/22/16)     Palliative Assessment/Data: 30 % at best   Flowsheet Rows   Flowsheet Row Most Recent Value  Intake Tab  Referral Department  Hospitalist  Unit at Time of Referral  Oncology Unit  Palliative Care Primary Diagnosis  Cancer  Date Notified  10/15/16  Palliative Care Type  New Palliative care  Reason for referral  Clarify Goals of Care  Date of Admission  10/14/16  # of days IP prior to Palliative referral  1  Clinical Assessment  Psychosocial &  Spiritual Assessment  Palliative Care Outcomes     Discussed with Dr Wynetta Emery  Time In:  1500 Time Out: 1615 Time Total: 75 min Greater than 50%  of this time was spent counseling and coordinating care related to the above assessment and plan.  Signed by: Wadie Lessen, NP   Please contact Palliative Medicine Team phone at [phone] for questions and concerns.  For individual provider: See Shea Evans

## 2016-10-16 NOTE — Telephone Encounter (Signed)
Called and spoke with RN Festus Holts, asked how patient was and that her rad x toway was scheduled for 930am, and tomedicate for pain as she will be lying on hard table 58minutes, patient was up in chair and talking with RN ,thanked RN 9:09 AM

## 2016-10-17 ENCOUNTER — Telehealth: Payer: Self-pay | Admitting: *Deleted

## 2016-10-17 ENCOUNTER — Ambulatory Visit
Admission: RE | Admit: 2016-10-17 | Discharge: 2016-10-17 | Disposition: A | Discharge status: Home / Self Care | Payer: Medicare Other | Source: Ambulatory Visit | Attending: Radiation Oncology | Admitting: Radiation Oncology

## 2016-10-17 DIAGNOSIS — Z794 Long term (current) use of insulin: Secondary | ICD-10-CM

## 2016-10-17 DIAGNOSIS — E119 Type 2 diabetes mellitus without complications: Secondary | ICD-10-CM

## 2016-10-17 DIAGNOSIS — K5903 Drug induced constipation: Secondary | ICD-10-CM

## 2016-10-17 DIAGNOSIS — G9520 Unspecified cord compression: Secondary | ICD-10-CM

## 2016-10-17 DIAGNOSIS — R29898 Other symptoms and signs involving the musculoskeletal system: Secondary | ICD-10-CM

## 2016-10-17 DIAGNOSIS — C7951 Secondary malignant neoplasm of bone: Secondary | ICD-10-CM

## 2016-10-17 LAB — CBC WITH DIFFERENTIAL/PLATELET
BASOS ABS: 0 10*3/uL (ref 0.0–0.1)
BASOS PCT: 0 %
EOS PCT: 0 %
Eosinophils Absolute: 0 10*3/uL (ref 0.0–0.7)
HEMATOCRIT: 28.1 % — AB (ref 36.0–46.0)
Hemoglobin: 9.6 g/dL — ABNORMAL LOW (ref 12.0–15.0)
Lymphocytes Relative: 1 %
Lymphs Abs: 0.1 10*3/uL — ABNORMAL LOW (ref 0.7–4.0)
MCH: 28.7 pg (ref 26.0–34.0)
MCHC: 34.2 g/dL (ref 30.0–36.0)
MCV: 84.1 fL (ref 78.0–100.0)
MONO ABS: 0.3 10*3/uL (ref 0.1–1.0)
MONOS PCT: 2 %
Neutro Abs: 14.4 10*3/uL — ABNORMAL HIGH (ref 1.7–7.7)
Neutrophils Relative %: 97 %
PLATELETS: 132 10*3/uL — AB (ref 150–400)
RBC: 3.34 MIL/uL — ABNORMAL LOW (ref 3.87–5.11)
RDW: 15.8 % — AB (ref 11.5–15.5)
WBC: 14.8 10*3/uL — ABNORMAL HIGH (ref 4.0–10.5)

## 2016-10-17 LAB — BASIC METABOLIC PANEL
Anion gap: 9 (ref 5–15)
BUN: 29 mg/dL — ABNORMAL HIGH (ref 6–20)
CO2: 23 mmol/L (ref 22–32)
Calcium: 9.6 mg/dL (ref 8.9–10.3)
Chloride: 101 mmol/L (ref 101–111)
Creatinine, Ser: 0.71 mg/dL (ref 0.44–1.00)
GFR calc Af Amer: >60 mL/min (ref >60)
GFR calc non Af Amer: >60 mL/min (ref >60)
Glucose, Bld: 204 mg/dL — ABNORMAL HIGH (ref 65–99)
Potassium: 4.8 mmol/L (ref 3.5–5.1)
Sodium: 133 mmol/L — ABNORMAL LOW (ref 135–145)

## 2016-10-17 MED ORDER — SODIUM CHLORIDE 0.9% FLUSH: 10.0000 mL | INTRAVENOUS | Status: DC | PRN

## 2016-10-17 MED ORDER — BISACODYL 10 MG RE SUPP: 10.0000 mg | Freq: Every day | RECTAL | Status: DC | PRN

## 2016-10-17 MED ORDER — HYDROMORPHONE HCL 2 MG/ML IJ SOLN
2.0000 mg | INTRAMUSCULAR | Status: DC | PRN
  Administered 2016-10-17 – 2016-10-18 (×4): 2 mg via INTRAVENOUS
  Filled 2016-10-17 (×4): qty 1

## 2016-10-17 NOTE — Progress Notes (Signed)
PROGRESS NOTE    [name]   [id number]  DOB: 1951-07-12  DOA: 10/14/2016 PCP: Henrine Screws, MD   Brief Narrative:   66 y/o female with pancreatic cancer with leptomeningeal enhancement throughout the cerebellar folia, right occipital lobe, right parietal lobe, bilateral frontal lobes, and left temporal lobe, thoracic and cervical spine,  Liver, lungs and L2. There is a probable component of intraparenchymal brain metastasis. Initially seen at [facility]. Underwent liver biopsy which confirmed diagnosis of pancreatitic cancer. On systemic therapy with FOLFIRINOX and then Gemcitabine and Cisplatin.  Per Dr Lisbeth Renshaw, Rad Onc "Unfortunately she's progressed with new disease in the liver, lumbar spine, and concern for leptomeningeal disease seen on brain imaging. Her MRI of the cervical, thoracic, and lumbar spine was performed revealing concerns for extensive nodular enhancement coating the visualized distal thoracic cord and conus as well as cauda equina nerve roots. Abnormal focal enhancement at the left L2 vertebral body and pedicle as well as the left sacrum was noted. No evidence of cord edema was present. Her MRI brain on 09/19/16 revealed concerns with leptomeningeal metastasis primarily in the posterior cranial fossa and to a lesser extent within the cerebral hemispheres bilaterally in the right occipital lobe there is also concern for intraparenchymal metastases. She has been counseled on the role for palliative chemotherapy versus palliative care, and comes today to discuss the role of palliative radiation in the treatment of her disease." She presented to the ER for generalized weakness/ fatigue on 2/18. No longer able to use her legs. Has back pain - mid back, hip and b/l leg pain, constipation. Bladder scan revealed 642 cc of urine in ER.    Subjective: Asking constantly about when she will start PT- per Df Moody's note on 2/16, was asking the same. Unable to lift legs for  me. Barely can rotate them and slide them from side to side. Pain is mostly in mid back and "up and down" legs.   Assessment & Plan:   Principal Problem:   Spinal cord compression due to malignant neoplasm of pancreas  - Mets to liver, lungs, L2,  Leptomeninges and probable intraparenchymal brain mets - unable to make decisions for herself - Urinary retention and constipation due to above  Plan:  - palliative radiation, Decadron being given - palliative consult for [place] Wadie Lessen to speak with patient's son) - pain control- MS Contin routine, Morphine for mod pain, Dilaudid for severe pain PRN -  Foley cath - laxatives- Miralax BID, Dulcolox suppository PRN - Creon   Active Problems:   Type 2 diabetes mellitus  - not being treated as mostly she is palliative care and sugars only in 200s with the Decadron    Hypertension - Norvasc, Metoprolol- Systolics in Q000111Q, 123456 - HCTZ stopped due to dehydration     DVT prophylaxis: Lovenox Code Status: DNR Family Communication:  Disposition Plan: to be determined Consultants:   Palliative care, Rad onc Procedures:   radiation Antimicrobials:  Anti-infectives    None       Objective: Vitals:   10/16/16 2250 10/17/16 0101 10/17/16 0414 10/17/16 1000  BP: (!) 175/91 (!) 148/92 (!) 175/82 (!) 158/98  Pulse: 82 78 83 75  Resp: 18 18 18    Temp: 97.6 F (36.4 C) 98.4 F (36.9 C) 98.2 F (36.8 C)   TempSrc: Axillary Oral Oral   SpO2: 100% 100% 100%   Weight:      Height:        Intake/Output Summary (Last  24 hours) at 10/17/16 1416 Last data filed at 10/17/16 [id number]  Gross per 24 hour  Intake              536 ml  Output             1620 ml  Net            -1084 ml   Filed Weights   10/15/16 1018  Weight: 77.1 kg (170 lb)    Examination: General exam: Appears comfortable  HEENT: PERRLA, oral mucosa moist, no sclera icterus or thrush Respiratory system: Clear to auscultation. Respiratory effort  normal. Cardiovascular system: S1 & S2 heard, RRR.  No murmurs  Gastrointestinal system: Abdomen soft, non-tender, nondistended. Normal bowel sound. No organomegaly Central nervous system: Alert and oriented. 4/5 weakness b/l legs Extremities: No cyanosis, clubbing or edema Skin: No rashes or ulcers Psychiatry:  Poor eye, contact flat affect, slowed responses to questions    Data Reviewed: I have personally reviewed following labs and imaging studies  CBC:  Recent Labs Lab 10/14/16 0950 10/14/16 1003 10/16/16 0455 10/17/16 0610  WBC 13.6*  --  16.1* 14.8*  NEUTROABS 12.3*  --  15.8* 14.4*  HGB 10.1* 11.2* 9.7* 9.6*  HCT 29.9* 33.0* 28.1* 28.1*  MCV 83.3  --  85.7 84.1  PLT 151  --  128* Q000111Q*   Basic Metabolic Panel:  Recent Labs Lab 10/14/16 0950 10/14/16 1003 10/16/16 0455 10/17/16 0610  NA 130* 131* 134* 133*  K 4.5 4.5 4.9 4.8  CL 99* 100* 105 101  CO2 21*  --  22 23  GLUCOSE 124* 125* 237* 204*  BUN 37* 37* 22* 29*  CREATININE 0.84 0.90 0.75 0.71  CALCIUM 9.8  --  9.0 9.6   GFR: Estimated Creatinine Clearance: 73.5 mL/min (by C-G formula based on SCr of 0.71 mg/dL). Liver Function Tests:  Recent Labs Lab 10/14/16 0950  AST 37  ALT 50  ALKPHOS 236*  BILITOT 1.3*  PROT 7.0  ALBUMIN 3.7   No results for input(s): LIPASE, AMYLASE in the last 168 hours. No results for input(s): AMMONIA in the last 168 hours. Coagulation Profile: No results for input(s): INR, PROTIME in the last 168 hours. Cardiac Enzymes: No results for input(s): CKTOTAL, CKMB, CKMBINDEX, TROPONINI in the last 168 hours. BNP (last 3 results) No results for input(s): PROBNP in the last 8760 hours. HbA1C: No results for input(s): HGBA1C in the last 72 hours. CBG:  Recent Labs Lab 10/15/16 2030 10/16/16 0825  GLUCAP 216* 217*   Lipid Profile: No results for input(s): CHOL, HDL, LDLCALC, TRIG, CHOLHDL, LDLDIRECT in the last 72 hours. Thyroid Function Tests: No results for  input(s): TSH, T4TOTAL, FREET4, T3FREE, THYROIDAB in the last 72 hours. Anemia Panel: No results for input(s): VITAMINB12, FOLATE, FERRITIN, TIBC, IRON, RETICCTPCT in the last 72 hours. Urine analysis: No results found for: COLORURINE, APPEARANCEUR, LABSPEC, PHURINE, GLUCOSEU, HGBUR, BILIRUBINUR, KETONESUR, PROTEINUR, UROBILINOGEN, NITRITE, LEUKOCYTESUR Sepsis Labs: @LABRCNTIP (procalcitonin:4,lacticidven:4) )No results found for this or any previous visit (from the past 240 hour(s)).       Radiology Studies: No results found.    Scheduled Meds: . amLODipine  5 mg Oral Daily  . bisacodyl  10 mg Oral Daily  . calcium-vitamin D  1 tablet Oral Q breakfast  . dexamethasone  4 mg Intravenous Q6H  . enoxaparin (LOVENOX) injection  40 mg Subcutaneous Q24H  . feeding supplement  1 Container Oral TID BM  . lipase/protease/amylase  72,000 Units Oral BID  AC  . metoprolol succinate  25 mg Oral Daily  . morphine  15 mg Oral Q12H  . polyethylene glycol  17 g Oral BID  . SONAFINE  1 application Topical Daily   Continuous Infusions:   LOS: 2 days    Time spent in minutes: 30    [place], MD [facility] Pager: [email or web address] Password [email or web address] 10/17/2016, 2:16 PM

## 2016-10-17 NOTE — Progress Notes (Signed)
   10/16/16 2250  What Happened  Was fall witnessed? No  Was patient injured? Unsure  Patient found on floor  Found by Staff-comment [facility] Parents NT  found pt on her buttocks sitting on the floor)  Stated prior activity ambulating-unassisted  Follow Up  MD notified Baltazar Najjar, MD)  Time MD notified 2241  Family notified Yes-comment [name] (Emergency contact))  Time family notified 2250  Additional tests No  Simple treatment Other (comment) (none needed. )  Adult Fall Risk Assessment  Risk Factor Category (scoring not indicated) High fall risk per protocol (document High fall risk)  Patient's Fall Risk High Fall Risk (>13 points)  Adult Fall Risk Interventions  Required Bundle Interventions *See Row Information* High fall risk - low, moderate, and high requirements implemented  Additional Interventions Camera surveillance (with patient/family notification & education);Individualized elimination schedule;Reorient/diversional activities with confused patients;Use of appropriate toileting equipment (bedpan, BSC, etc.)  Screening for Fall Injury Risk  Risk For Fall Injury- See Row Information  D;Nurse judgement (pt. has been confused lately per family)  Injury Prevention Interventions Camera surveillance (with patient/family notification & education)  Vitals  Temp 97.6 F (36.4 C)  Temp Source Axillary  BP (!) 175/91  BP Location Left Arm  BP Method Automatic  Patient Position (if appropriate) Lying  Pulse Rate 82  Pulse Rate Source Dinamap  Resp 18  Oxygen Therapy  SpO2 100 %  O2 Device Room Air  Pain Assessment  Pain Score 8 (slightly decreased, unchanged location from before. )  PCA/Epidural/Spinal Assessment  Respiratory Pattern Regular  Neurological  Neuro (WDL) X  Level of Consciousness Alert  Orientation Level Oriented to person;Oriented to place;Disoriented to time (intermittently confused; same as baseline. )  Cognition Impulsive;Poor attention/concentration;Poor  judgement;Poor safety awareness;Memory impairment  Speech Clear  Pupil Assessment  No  Motor Function/Sensation Assessment Grip  R Hand Grip Present  L Hand Grip Present  R Foot Dorsiflexion Present  L Foot Dorsiflexion Present  Neuro Symptoms Anxiety;Agitation;Forgetful  Neuro Additional Assessments No  Musculoskeletal  Musculoskeletal (WDL) X (very weak. )  Assistive Device None  Generalized Weakness Yes  Weight Bearing Restrictions No  Musculoskeletal Details  RLE Full movement (weak; same as baseline. )  LLE Full movement (weak, same as baseline)  Integumentary  Integumentary (WDL) WDL (old scratch to left buttocks; was there prior to fall. )

## 2016-10-17 NOTE — Progress Notes (Signed)
Daily Progress Note   Patient Name: [name]       Date: 10/17/2016 DOB: May 17, 1951  Age: 66 y.o. MRN#: [id number] Attending Physician: Debbe Odea, MD Primary Care Physician: Henrine Screws, MD Admit Date: 10/14/2016  Reason for Consultation/Follow-up: Establishing goals of care, Pain control and Psychosocial/spiritual support  Subjective: - spoke with Danille, SO of son/Johnathan and plan is to re-meet tomorrow at 3:00 for continued [place] discussion and clarification  -spoke with friend Marlow Baars and she too will be present   Length of Stay: 2  Current Medications: Scheduled Meds:  . amLODipine  5 mg Oral Daily  . bisacodyl  10 mg Oral Daily  . dexamethasone  4 mg Intravenous Q6H  . enoxaparin (LOVENOX) injection  40 mg Subcutaneous Q24H  . feeding supplement  1 Container Oral TID BM  . lipase/protease/amylase  72,000 Units Oral BID AC  . metoprolol succinate  25 mg Oral Daily  . morphine  15 mg Oral Q12H  . polyethylene glycol  17 g Oral BID  . SONAFINE  1 application Topical Daily    Continuous Infusions:   PRN Meds: bisacodyl, haloperidol lactate, hydrALAZINE, HYDROmorphone (DILAUDID) injection, morphine injection, ondansetron, pantoprazole  Physical Exam  Constitutional: She appears well-developed. She appears ill.  -confused and unable to form complete thought process  Cardiovascular: Normal rate, regular rhythm and normal heart sounds.   Pulmonary/Chest: Effort normal and breath sounds normal.  Neurological:  Oriented to person and place only  Skin: Skin is warm and dry.            Vital Signs: BP (!) 158/98   Pulse 75   Temp 98.2 F (36.8 C) (Oral)   Resp 18   Ht 5\' 6"  (1.676 m) Comment: 09/27/16  Wt 77.1 kg (170 lb) Comment: from  06/22/16  SpO2 100%   BMI 27.44 kg/m  SpO2: SpO2: 100 % O2 Device: O2 Device: Not Delivered O2 Flow Rate:    Intake/output summary:  Intake/Output Summary (Last 24 hours) at 10/17/16 1427 Last data filed at 10/17/16 [id number]  Gross per 24 hour  Intake              536 ml  Output             1620 ml  Net            -  1084 ml   LBM: Last BM Date: 10/10/16 (7 days ago per pt report) Baseline Weight: Weight:  (unable to stand at this time) Most recent weight: Weight: 77.1 kg (170 lb) (from 06/22/16)       Palliative Assessment/Data: 30 %     Flowsheet Rows   Flowsheet Row Most Recent Value  Intake Tab  Referral Department  Hospitalist  Unit at Time of Referral  Oncology Unit  Palliative Care Primary Diagnosis  Cancer  Date Notified  10/15/16  Palliative Care Type  New Palliative care  Reason for referral  Clarify Goals of Care  Date of Admission  10/14/16  Date first seen by Palliative Care  10/16/16  # of days Palliative referral response time  1 Day(s)  # of days IP prior to Palliative referral  1  Clinical Assessment  Palliative Performance Scale Score  30%  Psychosocial & Spiritual Assessment  Palliative Care Outcomes      Patient Active Problem List   Diagnosis Date Noted  . Weakness of lower extremity 10/14/2016  . Constipation 10/14/2016  . Urinary retention 10/14/2016  . Cancer associated pain 10/14/2016  . 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 Malignant neoplasm of head of pancreas ([place])   . Liver lesion   . Right knee pain 08/18/2013  . Patellar tendinitis 08/18/2013  . Tear of medial meniscus of right knee 08/18/2013  . Left shoulder pain 05/15/2012  . Dyspepsia 02/04/2012  . External hemorrhoid 10/15/2011  . Aortic stenosis   . Hypercholesterolemia   . MVP (mitral valve prolapse)   . Hypertensive cardiovascular disease   . HX: breast cancer   . History of migraines   . Disorders of bursae and  tendons in shoulder region, unspecified 09/20/2008  . Type 2 diabetes mellitus ([place]) 07/10/2007  . Hypertension 07/10/2007  . AORTIC STENOSIS 07/10/2007    Palliative Care Assessment & Plan   Patient Profile: 66 y.o. 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  Patient is treated at [facility] oncology  Presenting with a few weeks progressively worsening pain (mainly in the back and hips), weakness (whole body, but particuularly lower extremities, with worsening instability), 7 days constipation, and a few daysof difficulty urinating (last urinated this morning). Started palliative radiation of spinal cord last week in attempt to improve symptoms.   Significant symptom burden/ severe pain and cognitive changes.  Worsening lower extremity weakness  Patient and family face treatment options and advanced directive decisions and anticipatory care needs.  Assessment:  - continued physical, functional and cognitive decline 2/2 to metastatic pancreatic cancer  -faces EOL decisions and care needs  Recommendations/Plan:  Continue to treat the treatable, currently receiving palliative radiation  Re-meet tomorrow at 3;00 with family  Goals of Care and Additional Recommendations:  Limitations on Scope of Treatment: Full Scope Treatment  Code Status:    Code Status Orders        Start     Ordered   10/16/16 1548  Do not attempt resuscitation (DNR)  Continuous    Question Answer Comment  In the event of cardiac or respiratory ARREST Do not call a "code blue"   In the event of cardiac or respiratory ARREST Do not perform Intubation, CPR, defibrillation or ACLS   In the event of cardiac or respiratory ARREST Use medication by any route, position, wound  care, and other measures to relive pain and suffering. May use oxygen, suction and manual treatment of airway  obstruction as needed for comfort.      10/16/16 1547    Code Status History    Date Active Date Inactive Code Status Order ID Comments User Context   10/14/2016  3:11 PM 10/16/2016  3:47 PM Full Code [id number]  Gwynne Edinger, MD Inpatient    Advance Directive Documentation   Flowsheet Row Most Recent Value  Type of Advance Directive  Healthcare Power of Attorney  Pre-existing out of facility DNR order (yellow form or pink MOST form)  No data  "MOST" Form in Place?  No data       Prognosis:   < 6 weeks  Discharge Planning:  To Be Determined  Care plan was discussed with Worthy Flank PA-C  Thank you for allowing the Palliative Medicine Team to assist in the care of this patient.   Time In: 1500 Time Out: 1525 Total Time 25 min Prolonged Time Billed  no       Greater than 50%  of this time was spent counseling and coordinating care related to the above assessment and plan.  Wadie Lessen, NP  Please contact Palliative Medicine Team phone at [phone] for questions and concerns.

## 2016-10-17 NOTE — Plan of Care (Signed)
Problem: Safety: Goal: Ability to remain free from injury will improve Outcome: Not Progressing Patient sustained a fall last night. New interventions implemented: Tele-monitor, low bed, bed mat beside bed, and yellow socks placed back on patient.

## 2016-10-17 NOTE — Progress Notes (Signed)
Patient experienced a fall at 2241pm.  Patient per family has been increasingly more confused.  Patient was given pain medication at 2233 due to complaints of severe pain. At 2241 bed alarm sounded and NT immediately entered room.  She found patient on her buttocks on the floor.  Although fall was not witnessed in the room, NT entered quickly enough to observe that patient did not hit her head or injure any other area of her body.  Patient reports that she was just trying to get out of the bed. NT said that patient wanted to go home.  Patient had no new complaints of pain.  Her vitals and condition were both baseline.  Patient did have the bed alarm on prior to fall. She quickly jumped the bed rail and landed on the floor.  Per tech, patient had refused yellow socks in the bed prior to fall.  AC, MD, and Emergency Contact [name]) were notified immediately after fall.  [name] was assisted back to bed.  Order for a tele-sitter was initiated (no Catering manager available), patient was transferred to a low-bed, a bed mat was placed beside the bed, yellow socks were placed back on the patient, and bed alarm left on.  Assessment and vitals that were performed 2 hours later remained unchanged.  Will check vitals every 4 hours x 24 hours per protocol.  Will continue to monitor patient post fall. See Post-Fall flowsheet on following note.Roderick Pee

## 2016-10-17 NOTE — Telephone Encounter (Signed)
Called to check on patient status,  Asked to medicate patient with dilaudid today for pain, per Festus Holts RN patient still in a lot of pain will give dilaudid at 1:30pm; but not sure if she will be abe to tolerate treatment,thanked RN,called miranda and updatted statsu 1:02 PM

## 2016-10-17 NOTE — Progress Notes (Signed)
Chaplain stopped in to visit with patient from physician consult.  Patient welcomed Chaplain in but went right to sleep.  Patient wasn't available to speak with Chaplain.    Will follow up with patient later today.  [place] Resident   10/17/16 1000  Clinical Encounter Type  Visited With Patient not available  Visit Type Initial

## 2016-10-17 NOTE — Evaluation (Signed)
Physical Therapy Evaluation Patient Details Name: [name] MRN: [id number] DOB: [birth year] Today's Date: 10/17/2016   History of Present Illness  66 y.o. 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  Imaging showed mets at L2, R ilium, L sacrum. Pt admitted with a few weeks progressively worsening pain (mainly in the back and hips), weakness (whole body, but particuularly lower extremities, with worsening instability), 7 days constipation, and a few daysof difficulty urinating. Started palliative radiation of spinal cord last week in attempt to improve symptoms. Tolerating some po but in severe pain.   Clinical Impression  Pt admitted with above diagnosis. Pt currently with functional limitations due to the deficits listed below (see PT Problem List). +2 total assist for bed to bedside commode transfer, pt has profound weakness BLEs. She was lethargic during PT eval and unable to provide detailed info on her home situation, likely due to recent Haldol. 24* assist recommended.  Pt will benefit from skilled PT to increase their independence and safety with mobility to allow discharge to the venue listed below.       Follow Up Recommendations SNF;Supervision/Assistance - 24 hour    Equipment Recommendations  Wheelchair cushion (measurements PT);Wheelchair (measurements PT);Hospital bed    Recommendations for Other Services       Precautions / Restrictions Precautions Precautions: Fall Precaution Comments: fell in hospital last night Restrictions Weight Bearing Restrictions: No      Mobility  Bed Mobility Overal bed mobility: Needs Assistance Bed Mobility: Supine to Sit     Supine to sit: Total assist     General bed mobility comments: assist to raise trunk and advance BLEs (pt 20%)  Transfers Overall transfer level: Needs assistance   Transfers: Sit to/from Stand;Stand Pivot Transfers Sit to Stand: +2 physical  assistance;From elevated surface;Total assist Stand pivot transfers: +2 physical assistance;Total assist       General transfer comment: assist to rise/steady and pivot, BLEs buckled, SPT bed to 3 in 1 then back to bed, pt 15%  Ambulation/Gait             General Gait Details: unable  Stairs            Wheelchair Mobility    Modified Rankin (Stroke Patients Only)       Balance Overall balance assessment: Needs assistance;History of Falls   Sitting balance-Leahy Scale: Fair       Standing balance-Leahy Scale: Zero Standing balance comment: BLEs buckle                             Pertinent Vitals/Pain Pain Assessment: 0-10 Pain Score: 10-Worst pain ever Pain Location: back and both sides Pain Descriptors / Indicators: Sore Pain Intervention(s): Monitored during session;Limited activity within patient's tolerance;Patient requesting pain meds-RN notified    Home Living Family/patient expects to be discharged to:: Private residence Living Arrangements: Parent Available Help at Discharge: Available PRN/intermittently         Home Layout: One level Home Equipment: Walker - 2 wheels Additional Comments: pt lethargic and writhing in pain so was limited in ability to provide prior functional level. When asked if she had help for bathing she stated, "at times", when asked if she had a WC, she stated, "at times". No family present    Prior Function                 Hand Dominance  Extremity/Trunk Assessment   Upper Extremity Assessment Upper Extremity Assessment: Generalized weakness    Lower Extremity Assessment Lower Extremity Assessment: RLE deficits/detail;LLE deficits/detail RLE Deficits / Details: B knee ext -2/5, reports tingling B feet, limited assessment 2* pain/lethargy    Cervical / Trunk Assessment Cervical / Trunk Assessment: Normal  Communication      Cognition Arousal/Alertness: Lethargic;Suspect due to  medications   Overall Cognitive Status: No family/caregiver present to determine baseline cognitive functioning                 General Comments: increased time to respond to questions, vague responses to some questions,  possibly due to recent Haldol, oriented to self, year, location, can follow simple commands    General Comments      Exercises     Assessment/Plan    PT Assessment Patient needs continued PT services  PT Problem List Decreased strength;Decreased mobility;Decreased activity tolerance;Decreased balance;Pain;Impaired sensation       PT Treatment Interventions DME instruction;Gait training;Functional mobility training;Therapeutic activities;Therapeutic exercise;Balance training;Patient/family education;Wheelchair mobility training    PT Goals (Current goals can be found in the Care Plan section)  Acute Rehab PT Goals Patient Stated Goal: decrease pain PT Goal Formulation: With patient Time For Goal Achievement: 10/31/16 Potential to Achieve Goals: Fair    Frequency Min 2X/week   Barriers to discharge   unknown level of care at home    Co-evaluation               End of Session Equipment Utilized During Treatment: Gait belt Activity Tolerance: Patient limited by pain;Patient limited by lethargy Patient left: in bed;with call bell/phone within reach;Other (comment) (video monitor on) Nurse Communication: Mobility status PT Visit Diagnosis: Unsteadiness on feet (R26.81);Muscle weakness (generalized) (M62.81);Difficulty in walking, not elsewhere classified (R26.2);Pain Pain - Right/Left: Left Pain - part of body:  (back)         Time: [id number] PT Time Calculation (min) (ACUTE ONLY): 24 min   Charges:   PT Evaluation $PT Eval High Complexity: 1 Procedure PT Treatments $Therapeutic Activity: 8-22 mins   PT G Codes:         Philomena Doheny 10/17/2016, 10:13 AM [phone]

## 2016-10-17 NOTE — Progress Notes (Signed)
Patient has been sleeping soundly since returning from radiation, family at bedside and requesting that staff not provide any treatments to disturb patient at this time. Staff has checked on patient numerous times. Patient continues to sleep soundly, will continue to monitor.

## 2016-10-18 ENCOUNTER — Ambulatory Visit
Admission: RE | Admit: 2016-10-18 | Discharge: 2016-10-18 | Disposition: A | Discharge status: Home / Self Care | Payer: Medicare Other | Source: Ambulatory Visit | Attending: Radiation Oncology | Admitting: Radiation Oncology

## 2016-10-18 ENCOUNTER — Telehealth: Payer: Self-pay | Admitting: *Deleted

## 2016-10-18 DIAGNOSIS — Z66 Do not resuscitate: Secondary | ICD-10-CM

## 2016-10-18 DIAGNOSIS — Z515 Encounter for palliative care: Secondary | ICD-10-CM

## 2016-10-18 DIAGNOSIS — R29898 Other symptoms and signs involving the musculoskeletal system: Secondary | ICD-10-CM

## 2016-10-18 MED ORDER — DEXAMETHASONE SODIUM PHOSPHATE 10 MG/ML IJ SOLN
2.0000 mg | Freq: Two times a day (BID) | INTRAMUSCULAR | Status: DC
  Administered 2016-10-18: 2 mg via INTRAVENOUS
  Administered 2016-10-19: 10:00:00 via INTRAVENOUS
  Filled 2016-10-18 (×2): qty 1

## 2016-10-18 MED ORDER — HYDROMORPHONE HCL 2 MG/ML IJ SOLN
2.0000 mg | INTRAMUSCULAR | Status: DC | PRN
  Administered 2016-10-18 – 2016-10-19 (×7): 2 mg via INTRAVENOUS
  Filled 2016-10-18 (×7): qty 1

## 2016-10-18 MED ORDER — DEXAMETHASONE SODIUM PHOSPHATE 10 MG/ML IJ SOLN: 2.0000 mg | Freq: Four times a day (QID) | INTRAMUSCULAR | Status: DC

## 2016-10-18 NOTE — Consult Note (Signed)
[employer] Received request from [place] for family interest in [facility]. Chart reviewed and appreciate report from [place]. [facility] room available for patient 10/19/16. Paper work is being completed with Marlow Baars and patient's son.   Please fax discharge summary to [phone].  RN please call report to [phone].  Thank you,  Erling Conte, LCSW [phone]

## 2016-10-18 NOTE — Progress Notes (Signed)
Daily Progress Note   Patient Name: [name]       Date: 10/18/2016 DOB: 1951/07/21  Age: 66 y.o. MRN#: [id number] Attending Physician: April Odea, MD Primary Care Physician: April Screws, MD Admit Date: 10/14/2016  Reason for Consultation/Follow-up: Establishing goals of care, Pain control and Psychosocial/spiritual support  Subjective  - continued conversation with family (son, daughter, friend)  at bedside to discuss diagnosis, prognosis, GOCs anticipatory care needs  -family verbalize understanding of the limited prognosis and they hope for comfort and dignity at this time  Length of Stay: 3  Current Medications: Scheduled Meds:  . amLODipine  5 mg Oral Daily  . bisacodyl  10 mg Oral Daily  . dexamethasone  4 mg Intravenous Q6H  . enoxaparin (LOVENOX) injection  40 mg Subcutaneous Q24H  . feeding supplement  1 Container Oral TID BM  . lipase/protease/amylase  72,000 Units Oral BID AC  . metoprolol succinate  25 mg Oral Daily  . morphine  15 mg Oral Q12H  . polyethylene glycol  17 g Oral BID  . SONAFINE  1 application Topical Daily    Continuous Infusions:   PRN Meds: bisacodyl, hydrALAZINE, HYDROmorphone (DILAUDID) injection, ondansetron, pantoprazole, sodium chloride flush  Physical Exam  Constitutional: She appears well-developed. She appears ill.  -confused and unable to form complete thought process  Cardiovascular: Normal rate, regular rhythm and normal heart sounds.   Pulmonary/Chest: Effort normal and breath sounds normal.  Neurological:  Oriented to person and place only  Skin: Skin is warm and dry.            Vital Signs: BP (!) 173/94 (BP Location: Left Arm)   Pulse 81   Temp 98 F (36.7 C) (Oral)   Resp 18   Ht 5\' 6"  (1.676 m)  Comment: 09/27/16  Wt 77.1 kg (170 lb) Comment: from 06/22/16  SpO2 99%   BMI 27.44 kg/m  SpO2: SpO2: 99 % O2 Device: O2 Device: Not Delivered O2 Flow Rate:    Intake/output summary:   Intake/Output Summary (Last 24 hours) at 10/18/16 1019 Last data filed at 10/18/16 0640  Gross per 24 hour  Intake               60 ml  Output  1450 ml  Net            -1390 ml   LBM: Last BM Date: 10/10/16 (informed by previous RN. ) Baseline Weight: Weight:  (unable to stand at this time) Most recent weight: Weight: 77.1 kg (170 lb) (from 06/22/16)       Palliative Assessment/Data: 30 %     Flowsheet Rows   Flowsheet Row Most Recent Value  Intake Tab  Referral Department  Hospitalist  Unit at Time of Referral  Oncology Unit  Palliative Care Primary Diagnosis  Cancer  Date Notified  10/15/16  Palliative Care Type  New Palliative care  Reason for referral  Clarify Goals of Care  Date of Admission  10/14/16  Date first seen by Palliative Care  10/16/16  # of days Palliative referral response time  1 Day(s)  # of days IP prior to Palliative referral  1  Clinical Assessment  Palliative Performance Scale Score  30%  Psychosocial & Spiritual Assessment  Palliative Care Outcomes      Patient Active Problem List   Diagnosis Date Noted  . DNR (do not resuscitate)   . Palliative care by specialist   . Weakness of both lower extremities   . Weakness of lower extremity 10/14/2016  . Constipation 10/14/2016  . Urinary retention 10/14/2016  . Cancer associated pain 10/14/2016  . 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 Malignant neoplasm of head of pancreas ([place])   . Liver lesion   . Right knee pain 08/18/2013  . Patellar tendinitis 08/18/2013  . Tear of medial meniscus of right knee 08/18/2013  . Left shoulder pain 05/15/2012  . Dyspepsia 02/04/2012  . External hemorrhoid 10/15/2011  . Aortic stenosis   .  Hypercholesterolemia   . MVP (mitral valve prolapse)   . Hypertensive cardiovascular disease   . HX: breast cancer   . History of migraines   . Disorders of bursae and tendons in shoulder region, unspecified 09/20/2008  . Type 2 diabetes mellitus ([place]) 07/10/2007  . Hypertension 07/10/2007  . AORTIC STENOSIS 07/10/2007    Palliative Care Assessment & Plan   Patient Profile: 66 y.o. 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  Patient is treated at [facility] onc[facility]  Presenting with a few weeks progressively worsening pain (mainly in the back and hips), weakness (whole body, but particuularly lower extremities, with worsening instability), 7 days constipation, and a few daysof difficulty urinating (last urinated this morning). Started palliative radiation of spinal cord last week in attempt to improve symptoms.   Significant symptom burden/ severe pain and cognitive changes.  Worsening lower extremity weakness  Patient and family face treatment options and advanced directive decisions and anticipatory care needs.  Assessment:  - continued physical, functional and cognitive decline 2/2 to metastatic pancreatic cancer  -faces EOL decisions and care needs  Recommendations/Plan:  Shift to full comfort path, no further life prolonging interventions   Symptom management to enhance comfort, see orders/MAR  Hopeful for hospice facility for EOL care   Code Status:    Code Status Orders        Start     Ordered   10/16/16 1548  Do not attempt resuscitation (DNR)  Continuous    Question Answer Comment  In the event of cardiac or respiratory ARREST Do not call a "code blue"   In  the event of cardiac or respiratory ARREST Do not perform Intubation, CPR, defibrillation or ACLS   In the event of cardiac or respiratory ARREST Use medication by any route,  position, wound care, and other measures to relive pain and suffering. May use oxygen, suction and manual treatment of airway obstruction as needed for comfort.      10/16/16 1547    Code Status History    Date Active Date Inactive Code Status Order ID Comments User Context   10/14/2016  3:11 PM 10/16/2016  3:47 PM Full Code [id number]  April Edinger, MD Inpatient    Advance Directive Documentation   Flowsheet Row Most Recent Value  Type of Advance Directive  Healthcare Power of Attorney  Pre-existing out of facility DNR order (yellow form or pink MOST form)  No data  "MOST" Form in Place?  No data       Prognosis:  < 2 weeks  Discharge Planning:   Hopeful for hospice facility  Care plan was discussed with Dr Wynelle Cleveland and Worthy Flank PA-C  Thank you for allowing the Palliative Medicine Team to assist in the care of this patient.   Time In: 1500 Time Out:  1535 Total Time 35 min Prolonged Time Billed  no       Greater than 50%  of this time was spent counseling and coordinating care related to the above assessment and plan.  Wadie Lessen, NP  Please contact Palliative Medicine Team phone at [phone] for questions and concerns.

## 2016-10-18 NOTE — Progress Notes (Signed)
[facility] Therapy Treatment Record Phone [phone]   Radiation Therapy was administered to [name] on: 10/18/2016  10:18 AM and was treatment # 5 out of a planned course of 14 treatments.  Radiation Treatment  1). Beam photons with 6-10 energy  2). Brachytherapy None  3). Stereotactic Radiosurgery None  4). Other Radiation None     Allona Gondek G, RT (T)

## 2016-10-18 NOTE — Progress Notes (Signed)
PROGRESS NOTE    [name]   [id number]  DOB: 11-15-1950  DOA: 10/14/2016 PCP: Henrine Screws, MD   Brief Narrative:   66 y/o female with pancreatic cancer with leptomeningeal enhancement throughout the cerebellar folia, right occipital lobe, right parietal lobe, bilateral frontal lobes, and left temporal lobe, thoracic and cervical spine,  Liver, lungs and L2. There is a probable component of intraparenchymal brain metastasis. Initially seen at [facility]. Underwent liver biopsy which confirmed diagnosis of pancreatitic cancer. On systemic therapy with FOLFIRINOX and then Gemcitabine and Cisplatin.  Per Dr Lisbeth Renshaw, Rad Onc "Unfortunately she's progressed with new disease in the liver, lumbar spine, and concern for leptomeningeal disease seen on brain imaging. Her MRI of the cervical, thoracic, and lumbar spine was performed revealing concerns for extensive nodular enhancement coating the visualized distal thoracic cord and conus as well as cauda equina nerve roots. Abnormal focal enhancement at the left L2 vertebral body and pedicle as well as the left sacrum was noted. No evidence of cord edema was present. Her MRI brain on 09/19/16 revealed concerns with leptomeningeal metastasis primarily in the posterior cranial fossa and to a lesser extent within the cerebral hemispheres bilaterally in the right occipital lobe there is also concern for intraparenchymal metastases. She has been counseled on the role for palliative chemotherapy versus palliative care, and comes today to discuss the role of palliative radiation in the treatment of her disease." She presented to the ER for generalized weakness/ fatigue on 2/18. No longer able to use her legs. Has back pain - mid back, hip and b/l leg pain, constipation. Bladder scan revealed 642 cc of urine in ER.    Subjective: Asleep after receiving pain medications.   Assessment & Plan:   Principal Problem:   Spinal cord compression due to  malignant neoplasm of pancreas  - Mets to liver, lungs, L2,  Leptomeninges and probable intraparenchymal brain mets - unable to make decisions for herself - Urinary retention and constipation due to above  Plan:  - palliative radiation & Decadron being given - palliative consult for [place] - Wadie Lessen to speak with patient's son- she and I feel that this patient is appropriate for hospice - pain control- MS Contin routine, Morphine for mod pain, Dilaudid for severe pain PRN -  Foley cath - laxatives- Miralax BID, Dulcolox suppository PRN - Creon   Active Problems:   Type 2 diabetes mellitus  -  sugars only in 200s with the Decadron- not treating    Hypertension - Norvasc, Metoprolol- Systolics in Q000111Q, 123456 - HCTZ stopped due to dehydration     DVT prophylaxis: Lovenox Code Status: DNR Family Communication:  Disposition Plan: to be determined Consultants:   Palliative care, Rad onc Procedures:   radiation Antimicrobials:  Anti-infectives    None       Objective: Vitals:   10/17/16 1000 10/17/16 2100 10/18/16 0625 10/18/16 1442  BP: (!) 158/98 (!) 145/94 (!) 173/94 (!) 150/90  Pulse: 75 (!) 109 81 86  Resp:    16  Temp:  97.8 F (36.6 C) 98 F (36.7 C) 97.6 F (36.4 C)  TempSrc:  Oral Oral Oral  SpO2:  100% 99% 98%  Weight:      Height:        Intake/Output Summary (Last 24 hours) at 10/18/16 1517 Last data filed at 10/18/16 0640  Gross per 24 hour  Intake               60 ml  Output             1450 ml  Net            -1390 ml   Filed Weights   10/15/16 1018  Weight: 77.1 kg (170 lb)    Examination: General exam: Appears comfortable  HEENT: PERRLA, oral mucosa moist, no sclera icterus or thrush Respiratory system: Clear to auscultation. Respiratory effort normal. Cardiovascular system: S1 & S2 heard, RRR.  No murmurs  Gastrointestinal system: Abdomen soft, non-tender, nondistended. Normal bowel sound. No organomegaly Central nervous system: Alert  and oriented. 4/5 weakness b/l legs Extremities: No cyanosis, clubbing or edema Skin: No rashes or ulcers Psychiatry:  Poor eye, contact flat affect, slowed responses to questions    Data Reviewed: I have personally reviewed following labs and imaging studies  CBC:  Recent Labs Lab 10/14/16 0950 10/14/16 1003 10/16/16 0455 10/17/16 0610  WBC 13.6*  --  16.1* 14.8*  NEUTROABS 12.3*  --  15.8* 14.4*  HGB 10.1* 11.2* 9.7* 9.6*  HCT 29.9* 33.0* 28.1* 28.1*  MCV 83.3  --  85.7 84.1  PLT 151  --  128* Q000111Q*   Basic Metabolic Panel:  Recent Labs Lab 10/14/16 0950 10/14/16 1003 10/16/16 0455 10/17/16 0610  NA 130* 131* 134* 133*  K 4.5 4.5 4.9 4.8  CL 99* 100* 105 101  CO2 21*  --  22 23  GLUCOSE 124* 125* 237* 204*  BUN 37* 37* 22* 29*  CREATININE 0.84 0.90 0.75 0.71  CALCIUM 9.8  --  9.0 9.6   GFR: Estimated Creatinine Clearance: 73.5 mL/min (by C-G formula based on SCr of 0.71 mg/dL). Liver Function Tests:  Recent Labs Lab 10/14/16 0950  AST 37  ALT 50  ALKPHOS 236*  BILITOT 1.3*  PROT 7.0  ALBUMIN 3.7   No results for input(s): LIPASE, AMYLASE in the last 168 hours. No results for input(s): AMMONIA in the last 168 hours. Coagulation Profile: No results for input(s): INR, PROTIME in the last 168 hours. Cardiac Enzymes: No results for input(s): CKTOTAL, CKMB, CKMBINDEX, TROPONINI in the last 168 hours. BNP (last 3 results) No results for input(s): PROBNP in the last 8760 hours. HbA1C: No results for input(s): HGBA1C in the last 72 hours. CBG:  Recent Labs Lab 10/15/16 2030 10/16/16 0825  GLUCAP 216* 217*   Lipid Profile: No results for input(s): CHOL, HDL, LDLCALC, TRIG, CHOLHDL, LDLDIRECT in the last 72 hours. Thyroid Function Tests: No results for input(s): TSH, T4TOTAL, FREET4, T3FREE, THYROIDAB in the last 72 hours. Anemia Panel: No results for input(s): VITAMINB12, FOLATE, FERRITIN, TIBC, IRON, RETICCTPCT in the last 72 hours. Urine  analysis: No results found for: COLORURINE, APPEARANCEUR, LABSPEC, PHURINE, GLUCOSEU, HGBUR, BILIRUBINUR, KETONESUR, PROTEINUR, UROBILINOGEN, NITRITE, LEUKOCYTESUR Sepsis Labs: @LABRCNTIP (procalcitonin:4,lacticidven:4) )No results found for this or any previous visit (from the past 240 hour(s)).       Radiology Studies: No results found.    Scheduled Meds: . amLODipine  5 mg Oral Daily  . bisacodyl  10 mg Oral Daily  . dexamethasone  4 mg Intravenous Q6H  . enoxaparin (LOVENOX) injection  40 mg Subcutaneous Q24H  . feeding supplement  1 Container Oral TID BM  . lipase/protease/amylase  72,000 Units Oral BID AC  . metoprolol succinate  25 mg Oral Daily  . morphine  15 mg Oral Q12H  . polyethylene glycol  17 g Oral BID  . SONAFINE  1 application Topical Daily   Continuous Infusions:   LOS: 3 days  Time spent in minutes: 102    [place], MD [facility] Pager: [email or web address] Password [employer] 10/18/2016, 3:17 PM

## 2016-10-18 NOTE — NC FL2 (Signed)
[place] MEDICAID FL2 LEVEL OF CARE SCREENING TOOL     IDENTIFICATION  Patient Name: [name] Birthdate: Mar 25, 1951 Sex: female Admission Date (Current Location): 10/14/2016  [facility] and [place] Number:  Herbalist and Address:  [facility],  [place] [address] [place]      Provider Number: [id number]  Attending Physician Name and Address:  Debbe Odea, MD  Relative Name and Phone Number:       Current Level of Care: Hospital Recommended Level of Care: [place] Prior Approval Number:    Date Approved/Denied:   PASRR Number:    Discharge Plan: SNF    Current Diagnoses: Patient Active Problem List   Diagnosis Date Noted  . DNR (do not resuscitate)   . Palliative care by specialist   . Weakness of lower extremity 10/14/2016  . Constipation 10/14/2016  . Urinary retention 10/14/2016  . Cancer associated pain 10/14/2016  . 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 Malignant neoplasm of head of pancreas ([place])   . Liver lesion   . Right knee pain 08/18/2013  . Patellar tendinitis 08/18/2013  . Tear of medial meniscus of right knee 08/18/2013  . Left shoulder pain 05/15/2012  . Dyspepsia 02/04/2012  . External hemorrhoid 10/15/2011  . Aortic stenosis   . Hypercholesterolemia   . MVP (mitral valve prolapse)   . Hypertensive cardiovascular disease   . HX: breast cancer   . History of migraines   . Disorders of bursae and tendons in shoulder region, unspecified 09/20/2008  . Type 2 diabetes mellitus ([place]) 07/10/2007  . Hypertension 07/10/2007  . AORTIC STENOSIS 07/10/2007    Orientation RESPIRATION BLADDER Height & Weight     Self, Place  Normal Continent Weight: 170 lb (77.1 kg) (from 06/22/16) Height:  5\' 6"  (167.6 cm) (09/27/16)  BEHAVIORAL SYMPTOMS/MOOD NEUROLOGICAL BOWEL NUTRITION STATUS  Other (Comment) (Pt can be anxious at times.)   Continent  Diet  AMBULATORY STATUS COMMUNICATION OF NEEDS Skin   Extensive Assist Verbally Normal                       Personal Care Assistance Level of Assistance  Bathing, Feeding, Dressing Bathing Assistance: Maximum assistance   Dressing Assistance: Maximum assistance     Functional Limitations Info  Sight, Hearing, Speech Sight Info: Adequate Hearing Info: Adequate Speech Info: Adequate    SPECIAL CARE FACTORS FREQUENCY  PT (By licensed PT), OT (By licensed OT)     PT Frequency: 5x wk OT Frequency: 5x wk            Contractures Contractures Info: Not present    Additional Factors Info  Code Status Code Status Info: DNR             Current Medications (10/18/2016):  This is the current hospital active medication list Current Facility-Administered Medications  Medication Dose Route Frequency Provider Last Rate Last Dose  . amLODipine (NORVASC) tablet 5 mg  5 mg Oral Daily Gwynne Edinger, MD   5 mg at 10/17/16 1017  . bisacodyl (DULCOLAX) EC tablet 10 mg  10 mg Oral Daily Mauricio Gerome Apley, MD   10 mg at 10/17/16 1018  . bisacodyl (DULCOLAX) suppository 10 mg  10 mg Rectal Daily PRN Debbe Odea, MD      . dexamethasone (DECADRON) injection 4 mg  4 mg Intravenous Q6H Jani Gravel, MD   4 mg at 10/18/16  [id number]  . enoxaparin (LOVENOX) injection 40 mg  40 mg Subcutaneous Q24H Gwynne Edinger, MD   40 mg at 10/17/16 2021  . feeding supplement (BOOST / RESOURCE BREEZE) liquid 1 Container  1 Container Oral TID BM Mauricio Gerome Apley, MD   1 Container at 10/17/16 2000  . hydrALAZINE (APRESOLINE) injection 10 mg  10 mg Intravenous Q4H PRN Tawni Millers, MD   10 mg at 10/15/16 2350  . HYDROmorphone (DILAUDID) injection 2 mg  2 mg Intravenous Q3H PRN Knox Royalty, NP   2 mg at 10/18/16 0646  . lipase/protease/amylase (CREON) capsule 72,000 Units  72,000 Units Oral BID AC Gwynne Edinger, MD   72,000 Units at 10/16/16 0825  . metoprolol succinate (TOPROL-XL)  24 hr tablet 25 mg  25 mg Oral Daily Gwynne Edinger, MD   25 mg at 10/17/16 1017  . morphine (MS CONTIN) 12 hr tablet 15 mg  15 mg Oral Q12H Gwynne Edinger, MD   15 mg at 10/17/16 2021  . ondansetron (ZOFRAN) tablet 8 mg  8 mg Oral Q8H PRN Gwynne Edinger, MD      . pantoprazole (PROTONIX) EC tablet 40 mg  40 mg Oral Daily PRN Gwynne Edinger, MD      . polyethylene glycol (MIRALAX / GLYCOLAX) packet 17 g  17 g Oral BID Tawni Millers, MD   17 g at 10/17/16 2020  . sodium chloride flush (NS) 0.9 % injection 10-40 mL  10-40 mL Intracatheter PRN Debbe Odea, MD      . SONAFINE emulsion 1 application  1 application Topical Daily Berton Mount, [facility]         Discharge Medications: Please see discharge summary for a list of discharge medications.  Relevant Imaging Results:  Relevant Lab Results:   Additional Information SS # [id number]. Pt has radiaiton appts at [facility] in Feb / March.  Eurydice Calixto, Randall An, LCSW

## 2016-10-18 NOTE — Progress Notes (Signed)
CSW consulted to assist with d/c planning. PN reviewed. Palliative Care Team has recommended [facility]. CSW has met with pt's family to provide choice. Family has chosen [facility] for placement. Referral has been provided to Erling Conte, LCSW, liaison from [facility], and pt has been accepted. [facility] is able to admit pt in the am. CSW will assist with d/c planning to [facility].   Werner Lean LCSW [phone]

## 2016-10-18 NOTE — Progress Notes (Signed)
I met with the patient today. She is confused and although she is intentful with her speech, she verbalizes one thing, then states the exact opposite of whatever she's discussing. I spoke with the patient's friend [name], and also with palliative care. In summary she has leptomeningeal spread of disease with a history of metastatic pancreatic cancer. She is receiving craniospinal irradiation. She does not have a local medical oncologist, and her oncologist at [facility] has indicated that she does not have aggressive systemic options currently based on her CNS disease. We will have a family meeting today with palliative care, but would also like to include medical oncology's perspective. The patient's family members have been cared for by Dr. Jana Hakim, and [name] requests that we consult him if he's able to see her and weigh in on her systemic prognosis.      [name], PAC

## 2016-10-18 NOTE — Telephone Encounter (Signed)
Per Bryson Ha PA, patient needs to come before tx at 240pm, called [place] therapist, she stated then"we need to get her now only time left available to treat today", called the floor spoke with RN Earleen Newport:?, asked that she premedicate patuient now with dilaudid we are coming for the patient radiation now, Karleen [place] stated she would medicate the patient ,thanked RN,called [facility] back, they wil go get patienrt now 9:41 AM

## 2016-10-19 ENCOUNTER — Ambulatory Visit: Payer: Medicare Other

## 2016-10-19 ENCOUNTER — Encounter: Payer: Self-pay | Admitting: Radiation Oncology

## 2016-10-19 DIAGNOSIS — I10 Essential (primary) hypertension: Secondary | ICD-10-CM

## 2016-10-19 MED ORDER — CHLORHEXIDINE GLUCONATE 0.12 % MT SOLN: 15.0000 mL | Freq: Two times a day (BID) | OROMUCOSAL | Status: DC

## 2016-10-19 MED ORDER — BISACODYL 10 MG RE SUPP: 10.0000 mg | Freq: Every day | RECTAL | 0 refills | Status: AC | PRN

## 2016-10-19 MED ORDER — ORAL CARE MOUTH RINSE: 15.0000 mL | Freq: Two times a day (BID) | OROMUCOSAL | Status: DC

## 2016-10-19 NOTE — Progress Notes (Signed)
CSW assisting with d/c planning. Pt is ready to dc to [facility] this am. Pt's sister/friend signed admission papers yesterday with [employer]. CSW left a message  for Ms. Ria Comment this am confirming pt will dc this am and to call with any questions. [facility] transport is required. Medical necessity form completed. D/C Summary sent [facility] for review. No scripts required. # for report provided to nsg.  Werner Lean LCSW [phone]

## 2016-10-19 NOTE — Progress Notes (Signed)
I gave a report to Helene Kelp at [facility] prior to Ms [name] D/C

## 2016-10-19 NOTE — Discharge Summary (Signed)
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 [facility]) Active Problems:   Malignant neoplasm of head of pancreas ([place])   Urinary retention   Weakness of both lower extremities   Type 2 diabetes mellitus ([place])   Hypertension   Tear of medial meniscus of right knee   Constipation   Cancer associated pain   DNR (do not resuscitate)   Palliative care by specialist    Subjective: Sleeping, no complaints  Brief Summary: 66 y/o female with pancreatic cancer with leptomeningeal enhancement throughout the cerebellar folia, right occipital lobe, right parietal lobe, bilateral frontal lobes, and left temporal lobe, thoracic and cervical spine,  Liver, lungs and L2. There is a probable component of intraparenchymal brain metastasis. Initially seen at [facility]. Underwent liver biopsy which confirmed diagnosis of pancreatitic cancer. On systemic therapy with FOLFIRINOX and then Gemcitabine and Cisplatin.  Per Dr Lisbeth Renshaw, Rad Onc "Unfortunately she's progressed with new disease in the liver, lumbar spine, and concern for leptomeningeal disease seen on brain imaging. Her MRI of the cervical, thoracic, and lumbar spine was performed revealing concerns for extensive nodular enhancement coating the visualized distal thoracic cord and conus as well as cauda equina nerve roots. Abnormal focal enhancement at the left L2 vertebral body and pedicle as well as the left sacrum was noted. No evidence of cord edema was present. Her MRI brain on 09/19/16 revealed concerns with leptomeningeal  metastasis primarily in the posterior cranial fossa and to a lesser extent within the cerebral hemispheres bilaterally in the right occipital lobe there is also concern for intraparenchymal metastases. She has been counseled on the role for palliative chemotherapy versus palliative care, and comes today to discuss the role of palliative radiation in the treatment of her disease." She presented to the ER for generalized weakness/ fatigue on 2/18. No longer able to use her legs. Has back pain - mid back, hip and b/l leg pain, constipation. Bladder scan revealed 642 cc of urine in ER.    Hospital Course:  Principal Problem:   Spinal cord compression due to malignant neoplasm of pancreas  - Mets to liver, lungs, L2,  Leptomeninges and probable intraparenchymal brain mets - unable to make decisions for herself - Urinary retention and constipation due to above  Plan:  - palliative radiation & Decadron were being given - in the hospital she has been confused, requiring pain medication and mostly sleeping- severely weak in lower extremities 4/5 strength- Radiation oncology recommended palliative care and I was in agreement with this - palliative consulted for [place] spoke with patient's son and patient has been transitioned to comfort care - pain control- Dilaudid for severe pain PRN (used 14 mg yesterday) -  Foley cath fur urinary retention - laxatives- Miralax, Dulcolox suppository PRN - Creon if she eats will prevent diarrhea and pain  Active Problems:   Type 2 diabetes mellitus  -  sugars only in 200s with the Decadron- not treating    Hypertension - Norvasc, Metoprolol were being given  - HCTZ stopped due to dehydration  Discharge Instructions   Allergies as of  10/19/2016      Reactions   Shellfish Allergy Anaphylaxis   Prednisone Other (See Comments)   Iodine Other (See Comments)   unknown   Latex Rash   Penicillins Hives, Itching   Has patient had a PCN reaction  causing immediate rash, facial/tongue/throat swelling, SOB or lightheadedness with hypotension: yes Has patient had a PCN reaction causing severe rash involving mucus membranes or skin necrosis: no Has patient had a PCN reaction that required hospitalization : ed visit Has patient had a PCN reaction occurring within the last 10 years: yes If all of the above answers are "NO", then may proceed with Cephalosporin use.   Pravachol Itching, Other (See Comments)   Muscle pain   Pravastatin Itching, Other (See Comments)   Muscle pain   Rosuvastatin Other (See Comments)   Myalgias (high doses)      Medication List    STOP taking these medications   amLODipine 5 MG tablet Commonly known as:  NORVASC   calcium citrate-vitamin D 315-200 MG-UNIT tablet Commonly known as:  CITRACAL+D   emollient cream Commonly known as:  BIAFINE   hydrochlorothiazide 25 MG tablet Commonly known as:  HYDRODIURIL   HYDROcodone-acetaminophen 5-325 MG tablet Commonly known as:  NORCO   ibuprofen 800 MG tablet Commonly known as:  ADVIL,MOTRIN   Magnesium Oxide 400 (240 Mg) MG Tabs   metoprolol succinate 25 MG 24 hr tablet Commonly known as:  TOPROL-XL   morphine 15 MG tablet Commonly known as:  MSIR   ondansetron 8 MG tablet Commonly known as:  ZOFRAN   pantoprazole 40 MG tablet Commonly known as:  PROTONIX   traMADol 50 MG tablet Commonly known as:  ULTRAM     TAKE these medications   bisacodyl 10 MG suppository Commonly known as:  DULCOLAX Place 1 suppository (10 mg total) rectally daily as needed for moderate constipation.   CREON 36000 UNITS Cpep capsule Generic drug:  lipase/protease/amylase Take 72,000 capsules by mouth 2 (two) times daily at 8 am and 10 pm. Take 2 capsules before breakfast and dinner   dexamethasone 2 MG tablet Commonly known as:  DECADRON Take 2 tablets (4 mg total) by mouth 2 (two) times daily.       Allergies  Allergen Reactions  . Shellfish Allergy  Anaphylaxis  . Prednisone Other (See Comments)  . Iodine Other (See Comments)    unknown  . Latex Rash  . Penicillins Hives and Itching    Has patient had a PCN reaction causing immediate rash, facial/tongue/throat swelling, SOB or lightheadedness with hypotension: yes Has patient had a PCN reaction causing severe rash involving mucus membranes or skin necrosis: no Has patient had a PCN reaction that required hospitalization : ed visit Has patient had a PCN reaction occurring within the last 10 years: yes If all of the above answers are "NO", then may proceed with Cephalosporin use.   . Pravachol Itching and Other (See Comments)    Muscle pain  . Pravastatin Itching and Other (See Comments)    Muscle pain  . Rosuvastatin Other (See Comments)    Myalgias (high doses)     Procedures/Studies:  Dg Chest 2 View  Result Date: 10/14/2016 CLINICAL DATA:  66 year old female with history of generalize weakness following chemotherapy 2 days ago. History of breast and pancreatic cancer. EXAM: CHEST  2 VIEW COMPARISON:  Chest x-ray 10/19/2015. FINDINGS: Small nodular density projecting over the right lung base new compared to the prior examination, potentially within the right middle  or lower lobe (not clearly visualized on the lateral projection). Linear opacities in the left mid to lower lung, similar to prior study, most compatible with areas of mild chronic scarring. No acute consolidative airspace disease. No pleural effusions. No evidence of pulmonary edema. Heart size is normal. Upper mediastinal contours are within normal limits. Right internal jugular single-lumen porta cath with tip terminating at the superior cavoatrial junction. IMPRESSION: 1. No radiographic evidence of acute cardiopulmonary disease. 2. Small nodular density projecting over either the right middle or lower lobe. Given the patient's documented history of two primary malignancies, the possibility of metastatic disease should  be considered, and further evaluation with nonemergent noncontrast chest CT is recommended in the near future. Electronically Signed   By: Vinnie Langton M.D.   On: 10/14/2016 10:33   Ct Head Wo Contrast  Result Date: 10/14/2016 CLINICAL DATA:  Leg weakness, side not specified.  On chemotherapy. EXAM: CT HEAD WITHOUT CONTRAST TECHNIQUE: Contiguous axial images were obtained from the base of the skull through the vertex without intravenous contrast. COMPARISON:  05/28/2016 FINDINGS: Brain: No evidence of acute infarction, hemorrhage, hydrocephalus, extra-axial collection or mass lesion/mass effect. Subtle posterior right frontal sulcal density on axial slice 18 is not convincing for pathology on reformats. Vascular: Mild atherosclerotic calcification.  No hyperdense vessel Skull: No visible metastasis. Sinuses/Orbits: Negative IMPRESSION: No acute finding or change from prior. Electronically Signed   By: Monte Fantasia M.D.   On: 10/14/2016 10:18   Ct Thoracic Spine Wo Contrast  Result Date: 10/14/2016 CLINICAL DATA:  RIGHT flank pain, generalized weakness, pancreatic cancer, breast cancer, had chemotherapy 2 days ago EXAM: CT THORACIC AND LUMBAR SPINE WITHOUT CONTRAST TECHNIQUE: Multidetector CT imaging of the thoracic and lumbar spine was performed without contrast. Multiplanar CT image reconstructions were also generated. COMPARISON:  CT chest abdomen pelvis 05/26/2015 FINDINGS: CT THORACIC SPINE FINDINGS Alignment: Normal Vertebrae: 12 pairs of ribs. Vertebral body heights maintained without fracture or bone destruction. Generalized mild osseous demineralization. Paraspinal and other soft tissues: No abnormal paraspinal soft tissue. Visible mediastinum unremarkable. Coronary arterial calcification noted. Disc levels: Scattered multilevel disc space narrowing and small endplate spurs. No obvious disc herniation or intraspinal mass. CT LUMBAR SPINE FINDINGS Segmentation: 5 non-rib-bearing lumbar vertebra  Alignment: Normal Vertebrae: Sclerosis within the LEFT lateral aspect of the L2 vertebral body question sclerotic versus treated osseous metastasis. Patient had a subtle lucent lesion at this site on the prior CT exam. No additional focal osseous lesions identified. Facet degenerative changes lower lumbar spine. Sclerosis adjacent to RIGHT SI joint appears unchanged. Questionable sclerotic foci in RIGHT ilium image 242 new and LEFT sacrum image 230, more prominent. Paraspinal and other soft tissues: No focal disc herniation or neural compression. Paraspinal soft tissues normal appearance. Normal appendix. Disc levels: Mildly bulging discs at L3-L4, L4-L5, and L5-S1. No definite focal disc herniation. IMPRESSION: CT THORACIC SPINE IMPRESSION Mild scattered degenerative disc disease changes. No acute thoracic spine abnormalities. CT LUMBAR SPINE IMPRESSION Sclerotic lesions within the LEFT lateral aspect of the L2 vertebral body, RIGHT ilium, and LEFT sacrum, question sclerotic versus treated osseous metastases. Facet degenerative changes lower lumbar spine. Electronically Signed   By: Lavonia Dana M.D.   On: 10/14/2016 10:46   Ct Lumbar Spine Wo Contrast  Result Date: 10/14/2016 CLINICAL DATA:  RIGHT flank pain, generalized weakness, pancreatic cancer, breast cancer, had chemotherapy 2 days ago EXAM: CT THORACIC AND LUMBAR SPINE WITHOUT CONTRAST TECHNIQUE: Multidetector CT imaging of the thoracic and lumbar spine was  performed without contrast. Multiplanar CT image reconstructions were also generated. COMPARISON:  CT chest abdomen pelvis 05/26/2015 FINDINGS: CT THORACIC SPINE FINDINGS Alignment: Normal Vertebrae: 12 pairs of ribs. Vertebral body heights maintained without fracture or bone destruction. Generalized mild osseous demineralization. Paraspinal and other soft tissues: No abnormal paraspinal soft tissue. Visible mediastinum unremarkable. Coronary arterial calcification noted. Disc levels: Scattered  multilevel disc space narrowing and small endplate spurs. No obvious disc herniation or intraspinal mass. CT LUMBAR SPINE FINDINGS Segmentation: 5 non-rib-bearing lumbar vertebra Alignment: Normal Vertebrae: Sclerosis within the LEFT lateral aspect of the L2 vertebral body question sclerotic versus treated osseous metastasis. Patient had a subtle lucent lesion at this site on the prior CT exam. No additional focal osseous lesions identified. Facet degenerative changes lower lumbar spine. Sclerosis adjacent to RIGHT SI joint appears unchanged. Questionable sclerotic foci in RIGHT ilium image 242 new and LEFT sacrum image 230, more prominent. Paraspinal and other soft tissues: No focal disc herniation or neural compression. Paraspinal soft tissues normal appearance. Normal appendix. Disc levels: Mildly bulging discs at L3-L4, L4-L5, and L5-S1. No definite focal disc herniation. IMPRESSION: CT THORACIC SPINE IMPRESSION Mild scattered degenerative disc disease changes. No acute thoracic spine abnormalities. CT LUMBAR SPINE IMPRESSION Sclerotic lesions within the LEFT lateral aspect of the L2 vertebral body, RIGHT ilium, and LEFT sacrum, question sclerotic versus treated osseous metastases. Facet degenerative changes lower lumbar spine. Electronically Signed   By: Lavonia Dana M.D.   On: 10/14/2016 10:46   Mr Thoracic Spine W Wo Contrast  Result Date: 10/14/2016 CLINICAL DATA:  66 year old female with generalized weakness. Pancreatic and breast cancer status post chemotherapy 2 days ago. Right flank pain. Hepatic metastatic disease. EXAM: MRI THORACIC WITHOUT AND WITH CONTRAST TECHNIQUE: Multiplanar and multiecho pulse sequences of the thoracic spine were obtained without and with intravenous contrast. CONTRAST:  37mL MULTIHANCE GADOBENATE DIMEGLUMINE 529 MG/ML IV SOLN COMPARISON:  CT Abdomen and Pelvis 1006 hours today. CT thoracic and lumbar spine 1013 hours today. Chest CT 05/26/2015. FINDINGS: Limited sagittal  imaging of the cervical spine is normal aside from straightening of lordosis. Alignment: Thoracic vertebral height and alignment is stable since 2016. Vertebrae: Thoracic bone marrow signal remains normal. No thoracic marrow edema or evidence of acute osseous abnormality. There is a sclerotic, largely nonenhancing metastasis in L2 affecting the body, left pedicle and left transverse process (series 5, image 7 and series 12, image 45). Cord: Abnormal plaque-like enhancement along virtually the entire surface of the thoracic spinal cord (series 11 images 6 through 8 and series 12 images 9, 12, and 23. No associated spinal cord enlargement or edema. At the conus medullaris there is evidence of widespread proximal cauda equina nerve root enhancement (series 12, image 40) this is also evident as mild nodularity on the surface of the conus on sagittal T2 (series 10, image 6). The conus is at T12-L1. Paraspinal and other soft tissues: Visualized mediastinum and lung parenchyma is grossly negative. Visualized upper abdominal viscera are stable from the earlier CT Abdomen and Pelvis Disc levels: Age concordant thoracic disc degeneration. No thoracic spinal stenosis. IMPRESSION: 1. Positive for diffuse spinal leptomeningeal metastatic disease. Involvement of the thoracic spinal cord and visible cauda equina. No associated spinal cord edema. 2. No osseous metastatic disease or acute osseous abnormality in the thoracic spine. 3. L2 vertebral metastasis appears treated, with little to no edema or enhancement. Electronically Signed   By: Genevie Ann M.D.   On: 10/14/2016 13:56   Ct Renal Stone Study  Result Date: 10/14/2016 CLINICAL DATA:  Right flank pain. Evaluate for ureteral calculus. Undergoing chemotherapy for pancreatic and breast cancer. EXAM: CT ABDOMEN AND PELVIS WITHOUT CONTRAST TECHNIQUE: Multidetector CT imaging of the abdomen and pelvis was performed following the standard protocol without IV contrast. COMPARISON:   Abdominopelvic CT 05/26/2015. FINDINGS: Lower chest: There is fluctuating nodularity at the visualized lung bases. A focal irregular 9 mm nodule has developed adjacent to a right lower lobe granuloma on image 14. An irregular nodule previously noted more medially in the right lower lobe has resolved. There are new small solid right lower lobe nodules measuring 5 mm on image 18 and 30. There is also a new sub solid nodule in the left lower lobe on image 7. No significant pleural or pericardial effusion. Probable aortic valvular calcifications again noted. Hepatobiliary: Hepatic evaluation is limited by the lack of intravenous contrast. However, there are multiple hepatic metastases which have enlarged compared with the available prior study. The largest lesions measure 3.7 x 2.3 cm anteriorly in the dome of the right lobe (image number 8) and 5.0 x 3.9 cm peripherally in the inferior right lobe (image 22, previously 3.0 cm maximally). Several other smaller lesions are present. No evidence of gallstones, gallbladder wall thickening or biliary dilatation. Pancreas: No residual pancreatic tail mass identified. There is no pancreatic ductal dilatation or surrounding inflammation. Spleen: Normal in size without focal abnormality. Adrenals/Urinary Tract: There is new soft tissue thickening and nodularity of both adrenal glands, measuring up to 1.6 x 2.1 cm on the right and 2.5 x 1.4 cm on the left. Low-density renal cysts are grossly stable. There is no evidence of urinary tract calculus or hydronephrosis. The bladder appears unremarkable. Stomach/Bowel: No evidence of bowel wall thickening, distention or surrounding inflammatory change. There is moderate stool throughout the colon. The appendix appears normal. Vascular/Lymphatic: Previously noted prominent lymph nodes in the porta hepatis appear mildly improved. There is no retroperitoneal lymphadenopathy. No significant vascular findings are seen on noncontrast imaging.  Reproductive: The uterus and ovaries appear unremarkable. No evidence of adnexal mass. Other: Postsurgical changes in the anterior abdominal wall. No evidence of hernia, ascites or peritoneal nodularity. Musculoskeletal: Interval development of multiple sclerotic lesions consistent with treated metastatic disease. These are most prominent within the left aspect of the L2 vertebral body, the left sacrum, the right iliac bone and the proximal right femur. Mixed sclerotic and lucent lesion in the intertrochanteric region of the left femur is unchanged. No evidence of pathologic fracture. IMPRESSION: 1. No evidence of urinary tract calculus or hydronephrosis. 2. Progressive multifocal hepatic metastatic disease compared with available prior study from 2016. 3. Multiple sclerotic lesions are noted throughout the bones consistent with treated metastatic disease. 4. Interval development of bilateral adrenal gland thickening and nodularity, potentially metastatic disease as well. 5. Fluctuating nodularity of the lung bases with several new nodules as described. Electronically Signed   By: Richardean Sale M.D.   On: 10/14/2016 10:36       Discharge Exam: Vitals:   10/18/16 2328 10/19/16 0528  BP: (!) 141/89 (!) 178/108  Pulse: 81 (!) 117  Resp: 14 16  Temp: 97.9 F (36.6 C) 97.9 F (36.6 C)   Vitals:   10/18/16 0625 10/18/16 1442 10/18/16 2328 10/19/16 0528  BP: (!) 173/94 (!) 150/90 (!) 141/89 (!) 178/108  Pulse: 81 86 81 (!) 117  Resp:  16 14 16   Temp: 98 F (36.7 C) 97.6 F (36.4 C) 97.9 F (36.6 C) 97.9 F (  36.6 C)  TempSrc: Oral Oral Oral Axillary  SpO2: 99% 98% 99% 100%  Weight:      Height:        General: Pt is alert, awake, not in acute distress Cardiovascular: RRR, S1/S2 +, no rubs, no gallops Respiratory: CTA bilaterally, no wheezing, no rhonchi Abdominal: Soft, NT, ND, bowel sounds + Extremities: no edema, no cyanosis    The results of significant diagnostics from this  hospitalization (including imaging, microbiology, ancillary and laboratory) are listed below for reference.     Microbiology: No results found for this or any previous visit (from the past 240 hour(s)).   Labs: BNP (last 3 results) No results for input(s): BNP in the last 8760 hours. Basic Metabolic Panel:  Recent Labs Lab 10/14/16 0950 10/14/16 1003 10/16/16 0455 10/17/16 0610  NA 130* 131* 134* 133*  K 4.5 4.5 4.9 4.8  CL 99* 100* 105 101  CO2 21*  --  22 23  GLUCOSE 124* 125* 237* 204*  BUN 37* 37* 22* 29*  CREATININE 0.84 0.90 0.75 0.71  CALCIUM 9.8  --  9.0 9.6   Liver Function Tests:  Recent Labs Lab 10/14/16 0950  AST 37  ALT 50  ALKPHOS 236*  BILITOT 1.3*  PROT 7.0  ALBUMIN 3.7   No results for input(s): LIPASE, AMYLASE in the last 168 hours. No results for input(s): AMMONIA in the last 168 hours. CBC:  Recent Labs Lab 10/14/16 0950 10/14/16 1003 10/16/16 0455 10/17/16 0610  WBC 13.6*  --  16.1* 14.8*  NEUTROABS 12.3*  --  15.8* 14.4*  HGB 10.1* 11.2* 9.7* 9.6*  HCT 29.9* 33.0* 28.1* 28.1*  MCV 83.3  --  85.7 84.1  PLT 151  --  128* 132*   Cardiac Enzymes: No results for input(s): CKTOTAL, CKMB, CKMBINDEX, TROPONINI in the last 168 hours. BNP: Invalid input(s): POCBNP CBG:  Recent Labs Lab 10/15/16 2030 10/16/16 0825  GLUCAP 216* 217*   D-Dimer No results for input(s): DDIMER in the last 72 hours. Hgb A1c No results for input(s): HGBA1C in the last 72 hours. Lipid Profile No results for input(s): CHOL, HDL, LDLCALC, TRIG, CHOLHDL, LDLDIRECT in the last 72 hours. Thyroid function studies No results for input(s): TSH, T4TOTAL, T3FREE, THYROIDAB in the last 72 hours.  Invalid input(s): FREET3 Anemia work up No results for input(s): VITAMINB12, FOLATE, FERRITIN, TIBC, IRON, RETICCTPCT in the last 72 hours. Urinalysis No results found for: COLORURINE, APPEARANCEUR, LABSPEC, [place], GLUCOSEU, HGBUR, BILIRUBINUR, KETONESUR, PROTEINUR,  UROBILINOGEN, NITRITE, LEUKOCYTESUR Sepsis Labs Invalid input(s): PROCALCITONIN,  WBC,  LACTICIDVEN Microbiology No results found for this or any previous visit (from the past 240 hour(s)).   Time coordinating discharge: Over 30 minutes  SIGNED:   Debbe Odea, MD  [facility] 10/19/2016, 9:09 AM Pager   If 7PM-7AM, please contact night-coverage [email or web address] Password [email or web address]

## 2016-10-22 ENCOUNTER — Ambulatory Visit: Payer: Medicare Other

## 2016-10-23 ENCOUNTER — Ambulatory Visit: Payer: Medicare Other

## 2016-10-24 ENCOUNTER — Ambulatory Visit: Payer: Medicare Other

## 2016-10-25 ENCOUNTER — Ambulatory Visit: Payer: Medicare Other

## 2016-10-26 ENCOUNTER — Ambulatory Visit: Payer: Medicare Other

## 2016-10-29 ENCOUNTER — Ambulatory Visit: Payer: Medicare Other

## 2016-10-30 ENCOUNTER — Ambulatory Visit: Payer: Medicare Other

## 2016-10-31 ENCOUNTER — Ambulatory Visit: Payer: Medicare Other

## 2016-11-05 ENCOUNTER — Encounter: Payer: Self-pay | Admitting: Radiation Oncology

## 2016-11-05 NOTE — Progress Notes (Signed)
  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  The patient status declined and she was unable to continue her treatment after the first week.  Plan: The patient will return to clinic on a when necessary basis  ------------------------------------------------  Jodelle Gross, MD, PhD  This document serves as a record of services personally performed by Kyung Rudd, MD. It was created on his behalf by Bethann Humble, a trained medical scribe. The creation of this record is based on the scribe's personal observations and the provider's statements to them. This document has been checked and approved by the attending provider.

## 2016-11-13 DIAGNOSIS — C7931 Secondary malignant neoplasm of brain: Secondary | ICD-10-CM | POA: Insufficient documentation

## 2016-11-13 NOTE — Progress Notes (Signed)
  Radiation Oncology         [phone] ________________________________  Name: [name] MRN: [id number]  Date: 10/01/2016  DOB: April 23, 1951  SIMULATION AND TREATMENT PLANNING NOTE  DIAGNOSIS:     ICD-9-CM ICD-10-CM   1. Malignant neoplasm of head of pancreas (HCC) 157.0 C25.0 morphine 4 MG/ML injection 2 mg  2. Brain metastasis ([place]) 198.3 C79.31      Site:  Craniospinal XRT  NARRATIVE:  The patient was brought to the [facility].  Identity was confirmed.  All relevant records and images related to the planned course of therapy were reviewed.   Written consent to proceed with treatment was confirmed which was freely given after reviewing the details related to the planned course of therapy had been reviewed with the patient.  Then, the patient was set-up in a stable reproducible  supine position for radiation therapy.  CT images were obtained.  Surface markings were placed.    Medically necessary complex treatment device(s) for immobilization:  Customized vac lock bag.   The CT images were loaded into the planning software.  Then the target and avoidance structures were contoured.  Treatment planning then occurred.  The radiation prescription was entered and confirmed.   I have requested : Intensity Modulated Radiotherapy (IMRT) is medically necessary for this case for the following reason:  Sparing of critical normal structures including the kidneys bilaterally and bowel, esophagus.   PLAN:  The patient will receive 35 Gy in 13 fractions.  ________________________________   Jodelle Gross, MD, PhD

## 2016-11-25 DEATH — deceased

## 2017-04-04 IMAGING — CT CT RENAL STONE PROTOCOL
2 of 3 series · 15 of 46 positions shown, 17 images · non-contrast
Comparison: Abdominopelvic CT 05/26/2015.

CLINICAL DATA: Right flank pain. Evaluate for ureteral calculus.
Undergoing chemotherapy for pancreatic and breast cancer.

EXAM:
CT ABDOMEN AND PELVIS WITHOUT CONTRAST
TECHNIQUE: Multidetector CT imaging of the abdomen and pelvis was performed
following the standard protocol without IV contrast.

[Series 4: lung · axial · 0.73mm/px · z∈[-143,-31]mm · 12 of 66 slices shown, 14 images]
[im 5/66  soft-tissue]
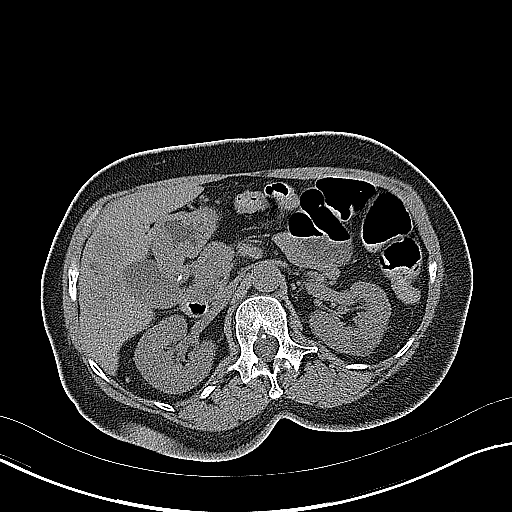
[im 5/66  bone]
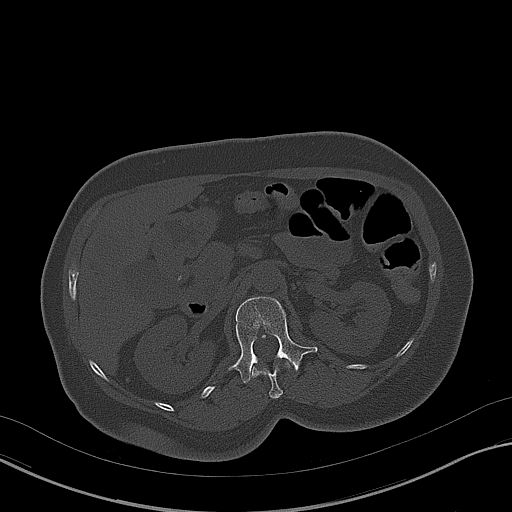
[im 9/66  soft-tissue]
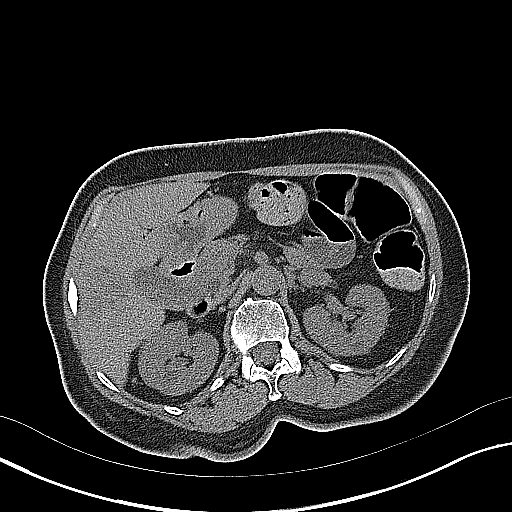
[im 15/66  soft-tissue]
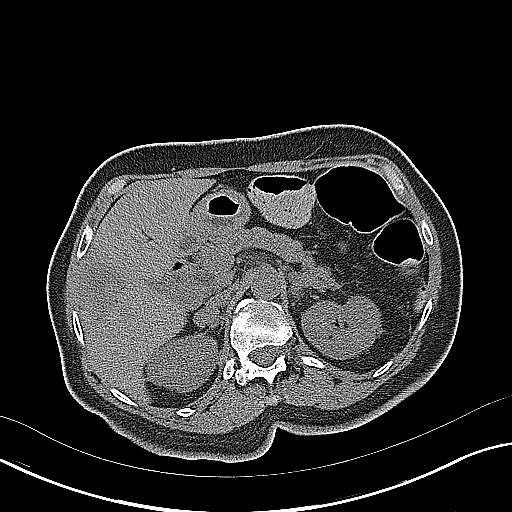
[im 19/66  soft-tissue]
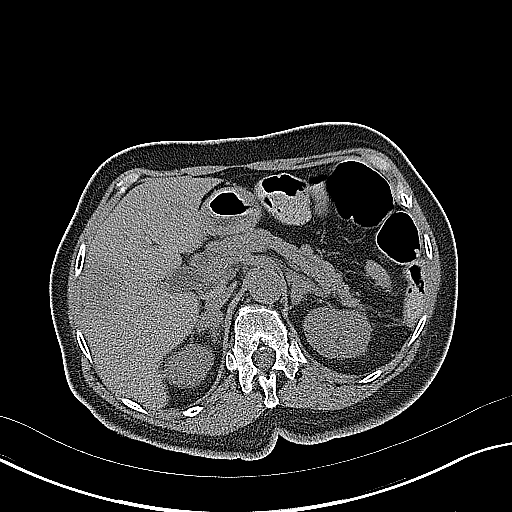
[im 26/66  soft-tissue]
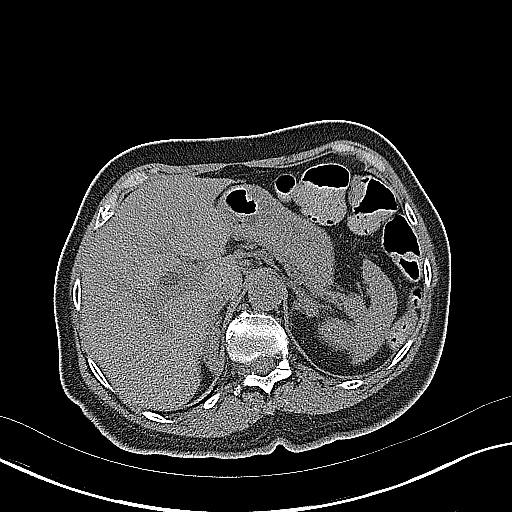
[im 30/66  soft-tissue]
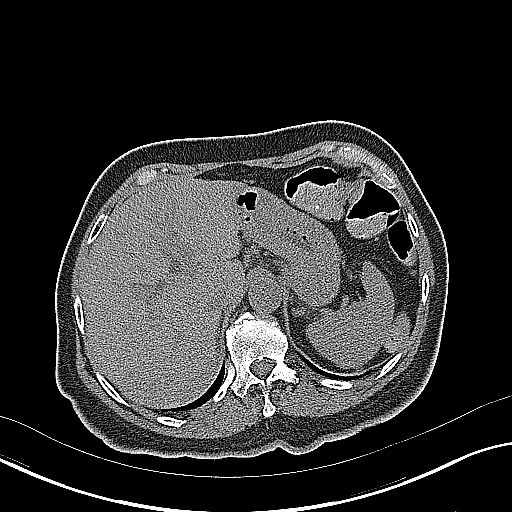
[im 36/66  soft-tissue]
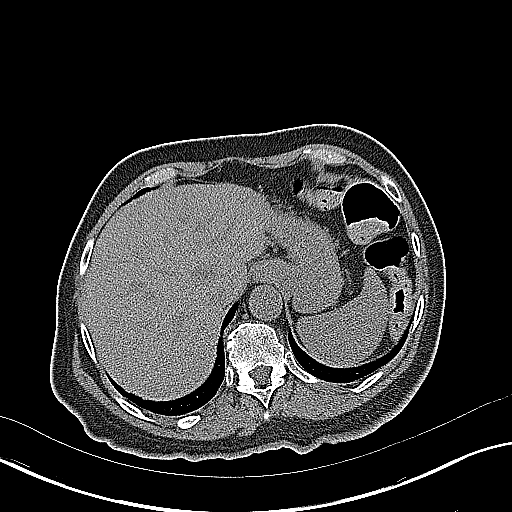
[im 40/66  soft-tissue]
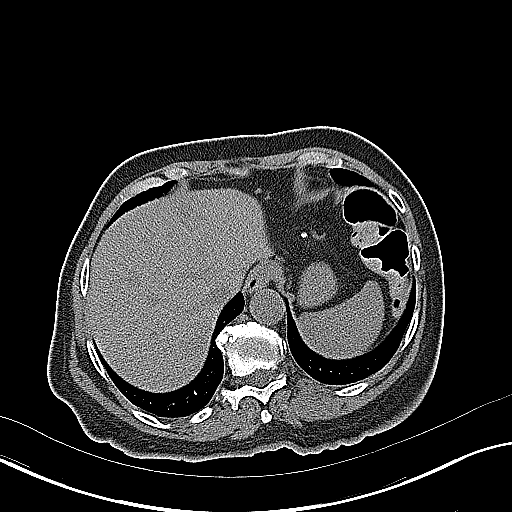
[im 47/66  soft-tissue]
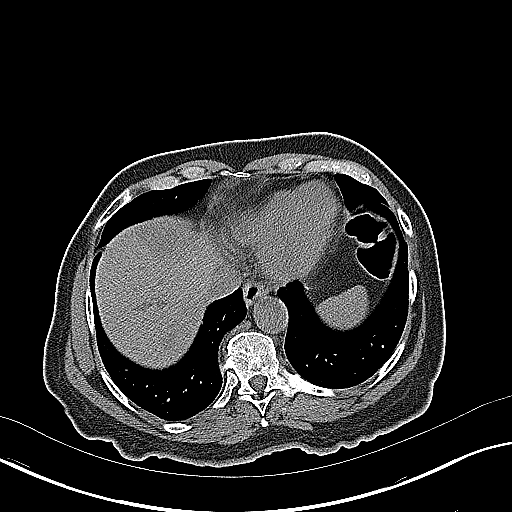
[im 47/66  bone]
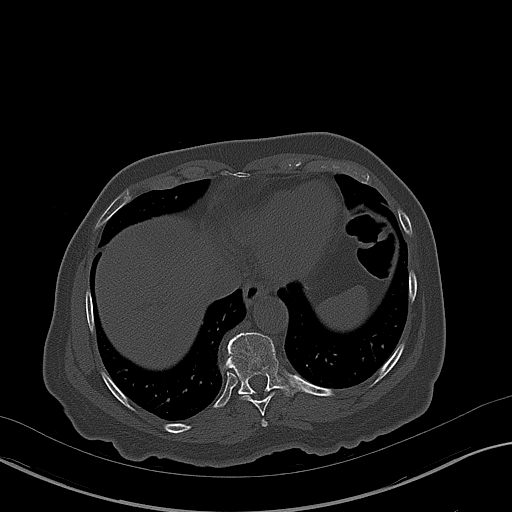
[im 51/66  soft-tissue]
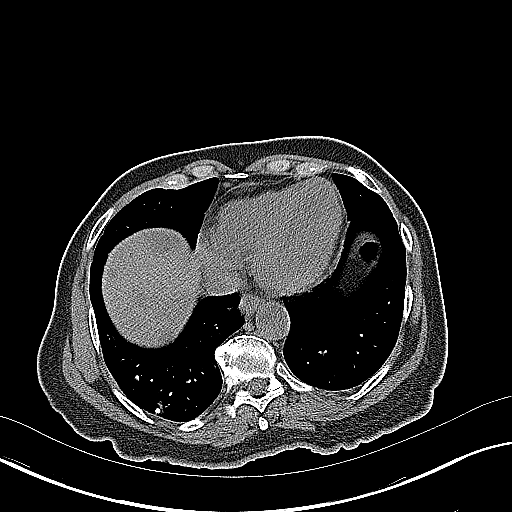
[im 57/66  soft-tissue]
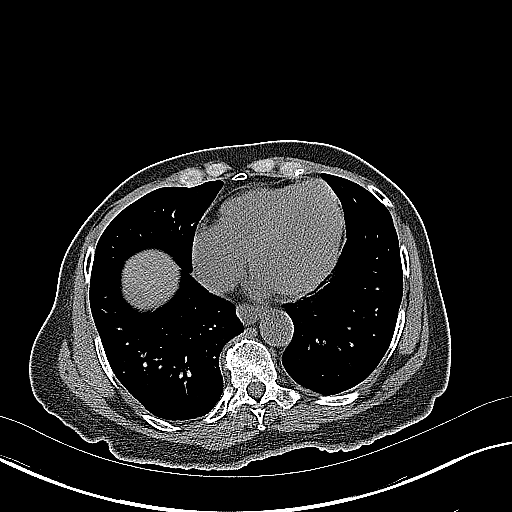
[im 61/66  soft-tissue]
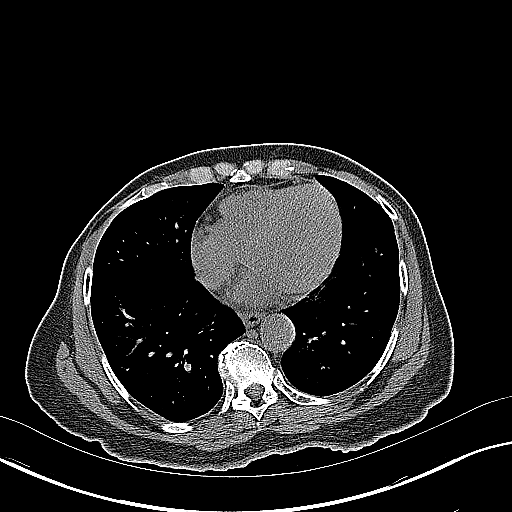

[Series 5: coronal · coronal · 0.69mm/px · 3 of 118 slices shown]
[im 40/118  soft-tissue]
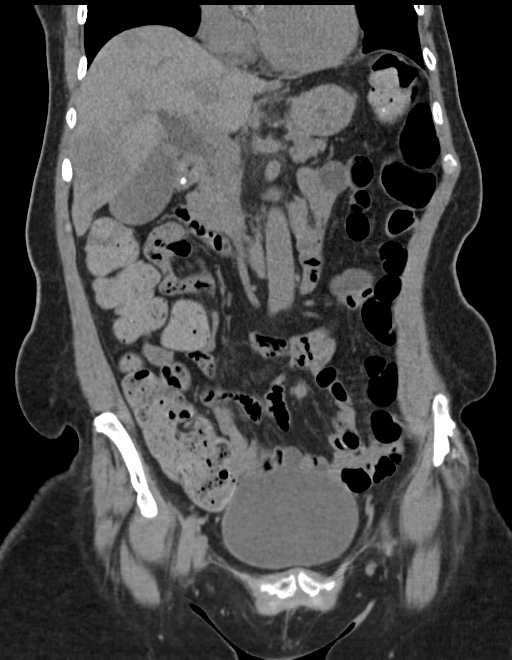
[im 53/118  soft-tissue]
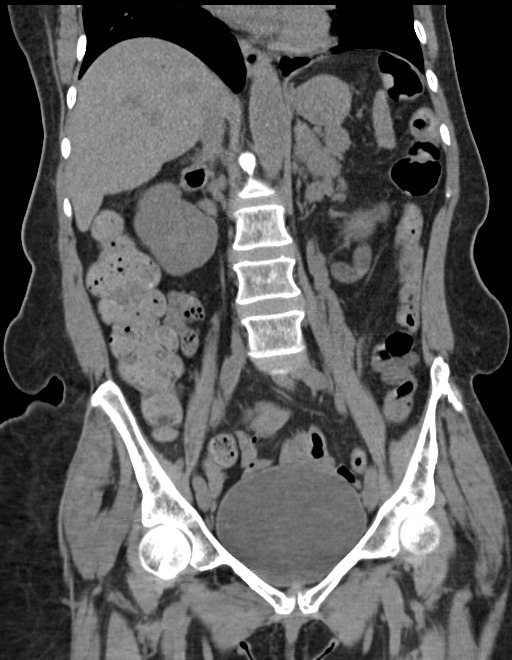
[im 66/118  soft-tissue]
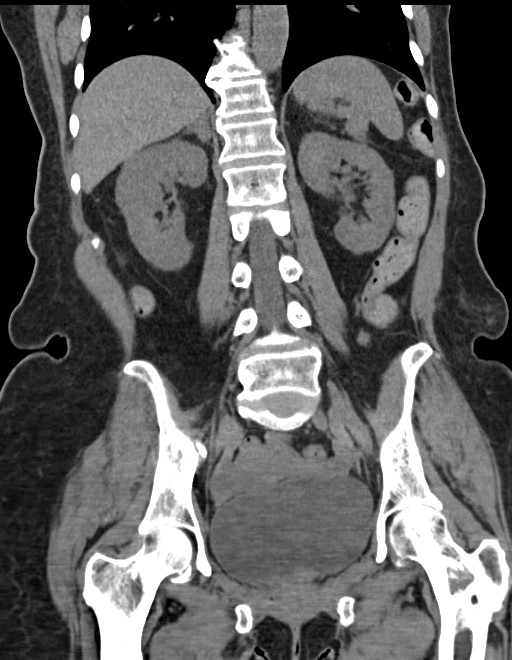

[15 of 46 positions shown; findings below may reference images not displayed]

FINDINGS: Lower chest: There is fluctuating nodularity at the visualized lung
bases. A focal irregular 9 mm nodule has developed adjacent to a
right lower lobe granuloma on image 14. An irregular nodule
previously noted more medially in the right lower lobe has resolved.
There are new small solid right lower lobe nodules measuring 5 mm on
image 18 and 30. There is also a new sub solid nodule in the left
lower lobe on image 7. No significant pleural or pericardial
effusion. Probable aortic valvular calcifications again noted.

Hepatobiliary: Hepatic evaluation is limited by the lack of
intravenous contrast. However, there are multiple hepatic metastases
which have enlarged compared with the available prior study. The
largest lesions measure 3.7 x 2.3 cm anteriorly in the dome of the
right lobe (image number 8) and 5.0 x 3.9 cm peripherally in the
inferior right lobe (image 22, previously 3.0 cm maximally). Several
other smaller lesions are present. No evidence of gallstones,
gallbladder wall thickening or biliary dilatation.

Pancreas: No residual pancreatic tail mass identified. There is no
pancreatic ductal dilatation or surrounding inflammation.

Spleen: Normal in size without focal abnormality.

Adrenals/Urinary Tract: There is new soft tissue thickening and
nodularity of both adrenal glands, measuring up to 1.6 x 2.1 cm on
the right and 2.5 x 1.4 cm on the left. Low-density renal cysts are
grossly stable. There is no evidence of urinary tract calculus or
hydronephrosis. The bladder appears unremarkable.

Stomach/Bowel: No evidence of bowel wall thickening, distention or
surrounding inflammatory change. There is moderate stool throughout
the colon. The appendix appears normal.

Vascular/Lymphatic: Previously noted prominent lymph nodes in the
porta hepatis appear mildly improved. There is no retroperitoneal
lymphadenopathy. No significant vascular findings are seen on
noncontrast imaging.

Reproductive: The uterus and ovaries appear unremarkable. No
evidence of adnexal mass.

Other: Postsurgical changes in the anterior abdominal wall. No
evidence of hernia, ascites or peritoneal nodularity.

Musculoskeletal: Interval development of multiple sclerotic lesions
consistent with treated metastatic disease. These are most prominent
within the left aspect of the L2 vertebral body, the left sacrum,
the right iliac bone and the proximal right femur. Mixed sclerotic
and lucent lesion in the intertrochanteric region of the left femur
is unchanged. No evidence of pathologic fracture.
IMPRESSION: 1. No evidence of urinary tract calculus or hydronephrosis.
2. Progressive multifocal hepatic metastatic disease compared with
available prior study from 3203.
3. Multiple sclerotic lesions are noted throughout the bones
consistent with treated metastatic disease.
4. Interval development of bilateral adrenal gland thickening and
nodularity, potentially metastatic disease as well.
5. Fluctuating nodularity of the lung bases with several new nodules
as described.
